# Patient Record
Sex: Female | Born: 1960 | Race: White | Hispanic: No | Marital: Married | Smoking: Never smoker
Health system: Southern US, Community
[De-identification: ages and names within clinical notes are randomized; demographics above are authoritative.]

## PROBLEM LIST (undated history)

## (undated) DIAGNOSIS — K219 Gastro-esophageal reflux disease without esophagitis: Secondary | ICD-10-CM

## (undated) DIAGNOSIS — I10 Essential (primary) hypertension: Secondary | ICD-10-CM

## (undated) HISTORY — DX: Gastro-esophageal reflux disease without esophagitis: K21.9

## (undated) HISTORY — PX: OTHER SURGICAL HISTORY: SHX169

---

## 1998-01-09 ENCOUNTER — Inpatient Hospital Stay (HOSPITAL_COMMUNITY): Admission: AD | Admit: 1998-01-09 | Discharge: 1998-01-09 | Payer: Self-pay | Admitting: Obstetrics and Gynecology

## 1998-05-17 ENCOUNTER — Inpatient Hospital Stay (HOSPITAL_COMMUNITY): Admission: AD | Admit: 1998-05-17 | Discharge: 1998-05-17 | Payer: Self-pay | Admitting: *Deleted

## 1998-06-07 ENCOUNTER — Inpatient Hospital Stay (HOSPITAL_COMMUNITY): Admission: AD | Admit: 1998-06-07 | Discharge: 1998-06-07 | Payer: Self-pay | Admitting: Obstetrics and Gynecology

## 1998-06-09 ENCOUNTER — Inpatient Hospital Stay (HOSPITAL_COMMUNITY): Admission: AD | Admit: 1998-06-09 | Discharge: 1998-06-09 | Payer: Self-pay | Admitting: Obstetrics and Gynecology

## 1998-06-17 ENCOUNTER — Inpatient Hospital Stay (HOSPITAL_COMMUNITY): Admission: AD | Admit: 1998-06-17 | Discharge: 1998-06-19 | Payer: Self-pay | Admitting: Obstetrics and Gynecology

## 1998-07-24 ENCOUNTER — Other Ambulatory Visit: Admission: RE | Admit: 1998-07-24 | Discharge: 1998-07-24 | Payer: Self-pay | Admitting: Obstetrics and Gynecology

## 1998-12-28 ENCOUNTER — Ambulatory Visit (HOSPITAL_COMMUNITY): Admission: RE | Admit: 1998-12-28 | Discharge: 1998-12-28 | Payer: Self-pay | Admitting: *Deleted

## 1999-08-22 ENCOUNTER — Other Ambulatory Visit: Admission: RE | Admit: 1999-08-22 | Discharge: 1999-08-22 | Payer: Self-pay | Admitting: Obstetrics and Gynecology

## 1999-10-11 ENCOUNTER — Inpatient Hospital Stay (HOSPITAL_COMMUNITY): Admission: AD | Admit: 1999-10-11 | Discharge: 1999-10-11 | Payer: Self-pay | Admitting: Obstetrics and Gynecology

## 2000-01-10 ENCOUNTER — Inpatient Hospital Stay (HOSPITAL_COMMUNITY): Admission: AD | Admit: 2000-01-10 | Discharge: 2000-01-10 | Payer: Self-pay | Admitting: *Deleted

## 2000-01-14 ENCOUNTER — Inpatient Hospital Stay (HOSPITAL_COMMUNITY): Admission: AD | Admit: 2000-01-14 | Discharge: 2000-01-14 | Payer: Self-pay | Admitting: Obstetrics & Gynecology

## 2000-01-30 ENCOUNTER — Ambulatory Visit (HOSPITAL_COMMUNITY): Admission: RE | Admit: 2000-01-30 | Discharge: 2000-01-30 | Payer: Self-pay | Admitting: Obstetrics and Gynecology

## 2000-02-14 ENCOUNTER — Inpatient Hospital Stay (HOSPITAL_COMMUNITY): Admission: AD | Admit: 2000-02-14 | Discharge: 2000-02-14 | Payer: Self-pay | Admitting: Obstetrics and Gynecology

## 2000-02-27 ENCOUNTER — Inpatient Hospital Stay (HOSPITAL_COMMUNITY): Admission: AD | Admit: 2000-02-27 | Discharge: 2000-02-29 | Payer: Self-pay | Admitting: Obstetrics and Gynecology

## 2000-04-08 ENCOUNTER — Other Ambulatory Visit: Admission: RE | Admit: 2000-04-08 | Discharge: 2000-04-08 | Payer: Self-pay | Admitting: Obstetrics and Gynecology

## 2001-09-17 ENCOUNTER — Ambulatory Visit (HOSPITAL_COMMUNITY): Admission: RE | Admit: 2001-09-17 | Discharge: 2001-09-17 | Payer: Self-pay | Admitting: Family Medicine

## 2001-09-17 ENCOUNTER — Encounter: Payer: Self-pay | Admitting: Family Medicine

## 2002-04-28 ENCOUNTER — Other Ambulatory Visit: Admission: RE | Admit: 2002-04-28 | Discharge: 2002-04-28 | Payer: Self-pay | Admitting: Obstetrics and Gynecology

## 2003-06-27 ENCOUNTER — Other Ambulatory Visit: Admission: RE | Admit: 2003-06-27 | Discharge: 2003-06-27 | Payer: Self-pay | Admitting: Obstetrics and Gynecology

## 2003-06-27 ENCOUNTER — Other Ambulatory Visit: Admission: RE | Admit: 2003-06-27 | Discharge: 2003-06-27 | Payer: Self-pay | Admitting: Physical Therapy

## 2004-10-13 HISTORY — PX: OTHER SURGICAL HISTORY: SHX169

## 2004-10-13 HISTORY — PX: CHOLECYSTECTOMY: SHX55

## 2004-10-23 ENCOUNTER — Other Ambulatory Visit: Admission: RE | Admit: 2004-10-23 | Discharge: 2004-10-23 | Payer: Self-pay | Admitting: Obstetrics and Gynecology

## 2005-10-13 HISTORY — PX: OTHER SURGICAL HISTORY: SHX169

## 2005-10-27 ENCOUNTER — Encounter: Admission: RE | Admit: 2005-10-27 | Discharge: 2005-10-27 | Payer: Self-pay | Admitting: Gastroenterology

## 2005-11-17 ENCOUNTER — Encounter (INDEPENDENT_AMBULATORY_CARE_PROVIDER_SITE_OTHER): Payer: Self-pay | Admitting: Specialist

## 2005-11-17 ENCOUNTER — Ambulatory Visit (HOSPITAL_COMMUNITY): Admission: RE | Admit: 2005-11-17 | Discharge: 2005-11-18 | Payer: Self-pay | Admitting: General Surgery

## 2005-12-24 ENCOUNTER — Encounter: Admission: RE | Admit: 2005-12-24 | Discharge: 2005-12-24 | Payer: Self-pay | Admitting: General Surgery

## 2006-06-24 ENCOUNTER — Ambulatory Visit (HOSPITAL_COMMUNITY): Admission: RE | Admit: 2006-06-24 | Discharge: 2006-06-24 | Payer: Self-pay | Admitting: Orthopedic Surgery

## 2008-02-22 ENCOUNTER — Encounter: Admission: RE | Admit: 2008-02-22 | Discharge: 2008-02-22 | Payer: Self-pay | Admitting: Family Medicine

## 2011-02-28 NOTE — Op Note (Signed)
Carol Lawson, Carol Lawson                 ACCOUNT NO.:  [id number]   MEDICAL RECORD NO.:  [id number]          PATIENT TYPE:  OIB   LOCATION:  1318                         FACILITY:  [facility]   PHYSICIAN:  Ollen Gross. Vernell Morgans, M.D. DATE OF BIRTH:  1960/12/19   DATE OF PROCEDURE:  11/17/2005  DATE OF DISCHARGE:  11/18/2005                                 OPERATIVE REPORT   PREOPERATIVE DIAGNOSIS:  Gallstones.   POSTOPERATIVE DIAGNOSIS:  Gallstones.   PROCEDURES:  Laparoscopic cholecystectomy with intraoperative cholangiogram.   SURGEON:  Ollen Gross. Carolynne Edouard, M.D.   ASSISTANT:  Leonie Man, M.D.   ANESTHESIA:  General endotracheal.   DESCRIPTION OF PROCEDURE:  After informed consent was obtained, the patient  was brought to the operating room and placed in a supine position on the  operating room table. After adequate induction of general anesthesia, the  patient's abdomen was prepped with Betadine and draped in the usual sterile  manner. The area below the umbilicus was infiltrated 0.25% Marcaine. A mall  incision was made with a 15 blade knife. This incision was carried down  through the subcutaneous tissue bluntly  with a hemostat and [employer]  retractors until the linea alba was identified. The linea alba was incised  with a 15 blade knife and each side was grasped with Kocher clamps and  elevated anteriorly. The preperitoneal space was then probed bluntly with a  hemostat until the peritoneum was opened access was gained to the abdominal  cavity. A #0 Vicryl pursestring stitch was placed in the fascia surrounding  the opening, Hasson cannula was placed through the opening and anchored in  place with the previously placed Vicryl pursestring stitch. The abdomen was  then insufflated with carbon dioxide without difficulty. A laparoscope was  placed through the Hasson cannula and the upper abdomen was inspected. There  were no real adhesions in the upper abdomen from her previous surgery. The  patient was then placed in head-up position and rotated slightly with the  right side up. Next the epigastric region was infiltrated with 0.25%  Marcaine. A small incision was made with a 15 blade knife and a 10 mm port  was placed bluntly through this incision into the abdominal cavity under  direct vision. Sites were then chosen laterally on the right side of the  abdomen for placement of 5 mm ports. Each of these areas was infiltrated  with 0.25% Marcaine. A small stab incision was made with a 15 blade knife  and 5 mm ports were placed bluntly through these incisions into the  abdominal cavity under direct vision. A blunt grasper was placed through the  lateral most 5 mm port and used to grasp the dome of gallbladder and  elevated anteriorly and superiorly. Another blunt grasper was placed through  the other 5 mm port and used to retract on the body and neck of the  gallbladder. A dissector was placed through the epigastric port and using  the electrocautery the peritoneal reflection at the gallbladder neck was  opened, blunt dissection was then carried out in this area until  the  gallbladder neck cystic duct junction was readily identified and a good  window was created. A single clip was placed on the gallbladder neck, a  small ductotomy was made just below the clip with the laparoscopic scissors.  A 14 gauge Angiocath was then placed percutaneously through the anterior  abdominal wall under direct vision. A Reddick cholangiogram catheter was  placed through the Angiocath and flushed. The Reddick catheter was then  placed within the cystic duct and anchored in place with the clip. A  cholangiogram was obtained that showed no filling defects, good emptying in  the duodenum and adequate length on the cystic duct. The anchoring clip and  catheters were then removed from the patient. Three clips were placed  proximally on the cystic duct and the duct was divided between the two sets  of  clips. Posterior to this, the cystic artery was identified and again  dissected bluntly in a circumferential manner until a good window was  created. Two clips were placed proximally and one distally on the artery and  the artery was divided between the two. Next a laparoscopic hook cautery  device was used to separate the gallbladder from the liver bed. Prior to  completely detaching the gallbladder from liver bed, the liver bed was  inspected and several small bleeding points were coagulated with the  electrocautery until the area was completely hemostatic. The gallbladder was  then detached the rest of the way from the liver bed without difficulty. A  laparoscopic bag was inserted through the epigastric port and gallbladder  was placed within the bag and the bag was sealed. Next, the abdomen was  irrigated with copious amounts of saline until the effluent was clear. The  laparoscope was then moved to the epigastric port and a gallbladder grasper  was placed through the Hasson cannula and used to grasp the opening of the  bag. The bag with the gallbladder was then removed through the  infraumbilical port without difficulty. The fascial defect was closed with  the previously placed Vicryl pursestring stitch as well was as with another  figure-of-eight #0 Vicryl stitch. The rest of the ports were removed under  direct vision and were found to be hemostatic. The gas was allowed to  escape. Skin incisions were all closed with interrupted 4-0 Monocryl  subcuticular stitches. Benzoin, Steri-Strips and sterile dressings were  applied. The patient tolerated the procedure well. At the end of the case,  all needle, sponge and instrument counts were correct. The patient was then  awakened and taken to the recovery room in stable condition.      Ollen Gross. Vernell Morgans, M.D.  Electronically Signed     PST/MEDQ  D:  11/20/2005  T:  11/20/2005  Job:  [id number]

## 2011-12-02 ENCOUNTER — Other Ambulatory Visit: Payer: Self-pay | Admitting: Gastroenterology

## 2013-10-03 ENCOUNTER — Encounter: Payer: Self-pay | Admitting: General Surgery

## 2013-10-04 ENCOUNTER — Encounter: Payer: Self-pay | Admitting: General Surgery

## 2013-10-04 ENCOUNTER — Other Ambulatory Visit: Payer: Self-pay | Admitting: *Deleted

## 2013-10-04 ENCOUNTER — Encounter: Payer: Self-pay | Admitting: Cardiology

## 2013-10-04 ENCOUNTER — Ambulatory Visit (INDEPENDENT_AMBULATORY_CARE_PROVIDER_SITE_OTHER): Payer: No Typology Code available for payment source | Admitting: Cardiology

## 2013-10-04 VITALS — BP 148/96 | HR 88 | Ht 63.0 in | Wt 164.0 lb

## 2013-10-04 DIAGNOSIS — I10 Essential (primary) hypertension: Secondary | ICD-10-CM

## 2013-10-04 DIAGNOSIS — R079 Chest pain, unspecified: Secondary | ICD-10-CM

## 2013-10-04 MED ORDER — HYDROCHLOROTHIAZIDE 25 MG PO TABS: 25.0000 mg | ORAL_TABLET | Freq: Every day | ORAL | Status: DC

## 2013-10-04 NOTE — Progress Notes (Signed)
  [address] [place], [place]  [address] Phone: [phone] Fax:  [phone]  Date:  10/04/2013   ID:  Carol Lawson, DOB 1961/02/07, MRN [id number]  PCP:  Sissy Hoff, MD  Cardiologist:  Armanda Magic, MD     History of Present Illness: Carol Lawson is a 52 y.o. female with a history of atypical CP with normal ETT in 2012 who presents today for evaluation of HTN.  She recently saw Dr. Rana Snare for perimenopausal symptoms and was placed on HRT and shortly after starting it she felt terrible.  She has been following her BP and it was elevated and she took the HRT patch off. She says since taking the patch off her BP has been running 138-160's/90's range off of the HRT.  She denies any SOB but has had some chest pain.  The CP occurs randomly throughout the day and usually lasts no more than 1 minutes.  She denies any LE edema, dizziness or palpitations   Wt Readings from Last 3 Encounters:  10/04/13 164 lb (74.39 kg)  10/03/13 167 lb 12.8 oz (76.114 kg)     Past Medical History  Diagnosis Date  . GERD (gastroesophageal reflux disease)     Current Outpatient Prescriptions  Medication Sig Dispense Refill  . zolpidem (AMBIEN) 10 MG tablet Take 10 mg by mouth at bedtime as needed.        No current facility-administered medications for this visit.    Allergies:   No Known Allergies  Social History:  The patient  reports that she has never smoked. She does not have any smokeless tobacco history on file. She reports that she drinks alcohol. She reports that she does not use illicit drugs.   Family History:  The patient's family history includes Breast cancer in her mother; Heart attack in her father; Heart disease in her father.   ROS:  Please see the history of present illness.      All other systems reviewed and negative.   PHYSICAL EXAM: VS:  BP 148/96  Pulse 88  Ht 5\' 3"  (1.6 m)  Wt 164 lb (74.39 kg)  BMI 29.06 kg/m2 Well nourished, well developed, in no  acute distress HEENT: normal Neck: no JVD Cardiac:  normal S1, S2; RRR; no murmur Lungs:  clear to auscultation bilaterally, no wheezing, rhonchi or rales Abd: soft, nontender, no hepatomegaly Ext: no edema Skin: warm and dry Neuro:  CNs 2-12 intact, no focal abnormalities noted  EKG:     NSR with no ST changes  ASSESSMENT AND PLAN:  1. HTN - she does not want to start an antihypertensive med but I explained to her that losing weight will take a while and for the interim she needs her BP better controlled.  I have stressed the importance of a low sodium diet and daily exercise  - start HCTZ 25mg  daily  - check BMET in 2 weeks 2. Atypical CP with normal EKG but family history of CAD at early age  - ETT to rule out ischemia  - 2D echo to assess for LVH and LVF  Followup with me in 2 weeks  Signed, Armanda Magic, MD 10/04/2013 2:32 PM

## 2013-10-04 NOTE — Patient Instructions (Addendum)
Your physician has recommended you make the following change in your medication: Start HCTZ 25 MG 1 tablet Daily  Your physician has requested that you have an exercise tolerance test. For further information please visit [email or web address] Please also follow instruction sheet, as given.  Your physician has requested that you have an echocardiogram. Echocardiography is a painless test that uses sound waves to create images of your heart. It provides your doctor with information about the size and shape of your heart and how well your heart's chambers and valves are working. This procedure takes approximately one hour. There are no restrictions for this procedure.  Your physician recommends that you return for lab work in: 2 weeks for a BMET Panel  Your physician recommends that you schedule a follow-up appointment in: 2 Weeks with Dr Mayford Knife

## 2013-10-07 ENCOUNTER — Ambulatory Visit (HOSPITAL_COMMUNITY): Payer: No Typology Code available for payment source | Attending: Cardiology | Admitting: Radiology

## 2013-10-07 ENCOUNTER — Encounter: Payer: Self-pay | Admitting: Cardiology

## 2013-10-07 DIAGNOSIS — I10 Essential (primary) hypertension: Secondary | ICD-10-CM | POA: Insufficient documentation

## 2013-10-07 DIAGNOSIS — I059 Rheumatic mitral valve disease, unspecified: Secondary | ICD-10-CM | POA: Insufficient documentation

## 2013-10-07 DIAGNOSIS — R079 Chest pain, unspecified: Secondary | ICD-10-CM

## 2013-10-07 DIAGNOSIS — R072 Precordial pain: Secondary | ICD-10-CM

## 2013-10-07 NOTE — Progress Notes (Signed)
Echocardiogram performed.  

## 2013-10-18 ENCOUNTER — Ambulatory Visit (HOSPITAL_COMMUNITY)
Admission: RE | Admit: 2013-10-18 | Discharge: 2013-10-18 | Disposition: A | Discharge status: Home / Self Care | Payer: No Typology Code available for payment source | Source: Ambulatory Visit | Attending: Cardiovascular Disease | Admitting: Cardiovascular Disease

## 2013-10-18 ENCOUNTER — Other Ambulatory Visit: Payer: No Typology Code available for payment source

## 2013-10-18 DIAGNOSIS — R079 Chest pain, unspecified: Secondary | ICD-10-CM

## 2013-10-20 ENCOUNTER — Encounter: Payer: Self-pay | Admitting: Cardiology

## 2013-10-20 ENCOUNTER — Ambulatory Visit (INDEPENDENT_AMBULATORY_CARE_PROVIDER_SITE_OTHER): Payer: No Typology Code available for payment source | Admitting: Cardiology

## 2013-10-20 ENCOUNTER — Other Ambulatory Visit: Payer: No Typology Code available for payment source

## 2013-10-20 ENCOUNTER — Encounter: Payer: Self-pay | Admitting: General Surgery

## 2013-10-20 VITALS — BP 115/78 | HR 92 | Ht 63.0 in | Wt 162.1 lb

## 2013-10-20 DIAGNOSIS — R51 Headache: Secondary | ICD-10-CM

## 2013-10-20 DIAGNOSIS — I10 Essential (primary) hypertension: Secondary | ICD-10-CM

## 2013-10-20 DIAGNOSIS — R519 Headache, unspecified: Secondary | ICD-10-CM | POA: Insufficient documentation

## 2013-10-20 DIAGNOSIS — G4710 Hypersomnia, unspecified: Secondary | ICD-10-CM

## 2013-10-20 LAB — BASIC METABOLIC PANEL
BUN: 12 mg/dL (ref 6–23)
CHLORIDE: 103 meq/L (ref 96–112)
CO2: 30 mEq/L (ref 19–32)
Calcium: 9.1 mg/dL (ref 8.4–10.5)
Creatinine, Ser: 0.9 mg/dL (ref 0.4–1.2)
GFR: 70.44 mL/min (ref >60.00)
Glucose, Bld: 111 mg/dL — ABNORMAL HIGH (ref 70–99)
POTASSIUM: 4.1 meq/L (ref 3.5–5.1)
SODIUM: 140 meq/L (ref 135–145)

## 2013-10-20 NOTE — Addendum Note (Signed)
Addended by: Eulis Foster on: 10/20/2013 08:45 AM   Modules accepted: Orders

## 2013-10-20 NOTE — Patient Instructions (Addendum)
Your physician recommends that you continue on your current medications as directed. Please refer to the Current Medication list given to you today.  Your physician has requested that you regularly monitor and record your blood pressure readings at home. Please use the same machine at the same time of day to check your readings. Please record them for one week and call us with the results at the end of the week. [phone]  Your physician has recommended that you have a sleep study. This test records several body functions during sleep, including: brain activity, eye movement, oxygen and carbon dioxide blood levels, heart rate and rhythm, breathing rate and rhythm, the flow of air through your mouth and nose, snoring, body muscle movements, and chest and belly movement.([facility] schedule [facility])  Your physician recommends that you schedule a follow-up appointment As Needed

## 2013-10-20 NOTE — Progress Notes (Signed)
  [place] [employer] [place], [place]  [address] Phone: [phone] Fax:  [phone]  Date:  10/20/2013   ID:  Carol Lawson, DOB Mar 24, 1961, MRN [id number]  PCP:  Gara Kroner, MD  Cardiologist:  Fransico Him, mD     History of Present Illness: Carol Lawson is a 53 y.o. female with a history of atypical CP with normal ETT in 2012 who presents recently for evaluation of HTN. She recently saw Dr. Corinna Capra for perimenopausal symptoms and was placed on HRT and shortly after starting it she felt terrible. She had been following her BP and it was elevated and she took the HRT patch off. Since taking the patch off her BP had been running 138-160's/90's range off of the HRT. She deniesd any SOB but has some chest pain. The CP occured randomly throughout the day and usually lasted no more than 1 minute. She denied any LE edema, dizziness or palpitations.  She underwent ETT which showed no ischemia but her baseline BP was 140/147mmHg.  At home the BP has been running 140/90-81mmHg at its highest and other times it is in the normal range.  She complains of intermittent headaches.  She also says that she feels exhausted during the day and is very tired during the day.  She does snore at night.   Wt Readings from Last 3 Encounters:  10/04/13 164 lb (74.39 kg)  10/03/13 167 lb 12.8 oz (76.114 kg)     Past Medical History  Diagnosis Date  . GERD (gastroesophageal reflux disease)     Current Outpatient Prescriptions  Medication Sig Dispense Refill  . hydrochlorothiazide (HYDRODIURIL) 25 MG tablet Take 1 tablet (25 mg total) by mouth daily.  30 tablet  11  . zolpidem (AMBIEN) 10 MG tablet Take 10 mg by mouth at bedtime as needed.        No current facility-administered medications for this visit.    Allergies:   No Known Allergies  Social History:  The patient  reports that she has never smoked. She does not have any smokeless tobacco history on file. She reports that she drinks alcohol. She  reports that she does not use illicit drugs.   Family History:  The patient's family history includes Breast cancer in her mother; Heart attack in her father; Heart disease in her father.   ROS:  Please see the history of present illness.      All other systems reviewed and negative.   PHYSICAL EXAM: VS:  There were no vitals taken for this visit. Well nourished, well developed, in no acute distress HEENT: normal Neck: no JVD Cardiac:  normal S1, S2; RRR; no murmur Lungs:  clear to auscultation bilaterally, no wheezing, rhonchi or rales Abd: soft, nontender, no hepatomegaly Ext: no edema Skin: warm and dry Neuro:  CNs 2-12 intact, no focal abnormalities noted      ASSESSMENT AND PLAN:  1. HTN - BP good today but her BP at the time of the ETT was 141/175mmHg.    - I have asked her to check her BP daily for a week and call with results 2. Chest pain with no ischemia on ETT. 3. Daytime fatigue and sleepiness with snoring and daytime headaches  - get a PSG to rule out sleep apnea  Followup PRN  Signed, Fransico Him, MD 10/20/2013 8:17 AM

## 2015-03-08 ENCOUNTER — Other Ambulatory Visit: Payer: Self-pay | Admitting: Obstetrics and Gynecology

## 2015-03-13 LAB — CYTOLOGY - PAP

## 2017-10-07 ENCOUNTER — Encounter (HOSPITAL_BASED_OUTPATIENT_CLINIC_OR_DEPARTMENT_OTHER): Payer: Self-pay

## 2017-10-07 ENCOUNTER — Other Ambulatory Visit: Payer: Self-pay

## 2017-10-07 ENCOUNTER — Emergency Department (HOSPITAL_BASED_OUTPATIENT_CLINIC_OR_DEPARTMENT_OTHER)
Admission: EM | Admit: 2017-10-07 | Discharge: 2017-10-08 | Disposition: A | Discharge status: Home / Self Care | Payer: No Typology Code available for payment source | Attending: Emergency Medicine | Admitting: Emergency Medicine

## 2017-10-07 DIAGNOSIS — R1010 Upper abdominal pain, unspecified: Secondary | ICD-10-CM | POA: Diagnosis present

## 2017-10-07 DIAGNOSIS — K5289 Other specified noninfective gastroenteritis and colitis: Secondary | ICD-10-CM | POA: Diagnosis not present

## 2017-10-07 DIAGNOSIS — I10 Essential (primary) hypertension: Secondary | ICD-10-CM | POA: Insufficient documentation

## 2017-10-07 DIAGNOSIS — K529 Noninfective gastroenteritis and colitis, unspecified: Secondary | ICD-10-CM

## 2017-10-07 HISTORY — DX: Essential (primary) hypertension: I10

## 2017-10-07 LAB — COMPREHENSIVE METABOLIC PANEL
ALT: 48 U/L (ref 14–54)
ANION GAP: 9 (ref 5–15)
AST: 32 U/L (ref 15–41)
Albumin: 4.6 g/dL (ref 3.5–5.0)
Alkaline Phosphatase: 89 U/L (ref 38–126)
BUN: 13 mg/dL (ref 6–20)
CHLORIDE: 103 mmol/L (ref 101–111)
CO2: 24 mmol/L (ref 22–32)
Calcium: 9.5 mg/dL (ref 8.9–10.3)
Creatinine, Ser: 0.68 mg/dL (ref 0.44–1.00)
GFR calc non Af Amer: >60 mL/min (ref >60)
Glucose, Bld: 113 mg/dL — ABNORMAL HIGH (ref 65–99)
Potassium: 3.7 mmol/L (ref 3.5–5.1)
SODIUM: 136 mmol/L (ref 135–145)
Total Bilirubin: 0.6 mg/dL (ref 0.3–1.2)
Total Protein: 8.1 g/dL (ref 6.5–8.1)

## 2017-10-07 LAB — CBC WITH DIFFERENTIAL/PLATELET
BASOS PCT: 0 %
Basophils Absolute: 0 10*3/uL (ref 0.0–0.1)
Eosinophils Absolute: 0.1 10*3/uL (ref 0.0–0.7)
Eosinophils Relative: 1 %
HEMATOCRIT: 45.8 % (ref 36.0–46.0)
HEMOGLOBIN: 15.7 g/dL — AB (ref 12.0–15.0)
LYMPHS ABS: 3.9 10*3/uL (ref 0.7–4.0)
Lymphocytes Relative: 26 %
MCH: 30.4 pg (ref 26.0–34.0)
MCHC: 34.3 g/dL (ref 30.0–36.0)
MCV: 88.6 fL (ref 78.0–100.0)
MONOS PCT: 8 %
Monocytes Absolute: 1.2 10*3/uL — ABNORMAL HIGH (ref 0.1–1.0)
NEUTROS ABS: 9.7 10*3/uL — AB (ref 1.7–7.7)
NEUTROS PCT: 65 %
Platelets: 213 10*3/uL (ref 150–400)
RBC: 5.17 MIL/uL — ABNORMAL HIGH (ref 3.87–5.11)
RDW: 12.6 % (ref 11.5–15.5)
WBC: 14.9 10*3/uL — ABNORMAL HIGH (ref 4.0–10.5)

## 2017-10-07 LAB — URINALYSIS, ROUTINE W REFLEX MICROSCOPIC
Bilirubin Urine: NEGATIVE
Glucose, UA: NEGATIVE mg/dL
Ketones, ur: NEGATIVE mg/dL
LEUKOCYTES UA: NEGATIVE
NITRITE: NEGATIVE
PH: 7 (ref 5.0–8.0)
Protein, ur: NEGATIVE mg/dL
SPECIFIC GRAVITY, URINE: 1.02 (ref 1.005–1.030)

## 2017-10-07 LAB — URINALYSIS, MICROSCOPIC (REFLEX): WBC UA: NONE SEEN WBC/hpf (ref 0–5)

## 2017-10-07 LAB — LIPASE, BLOOD: LIPASE: 35 U/L (ref 11–51)

## 2017-10-07 MED ORDER — ONDANSETRON HCL 4 MG/2ML IJ SOLN
4.0000 mg | Freq: Once | INTRAMUSCULAR | Status: AC | PRN
  Administered 2017-10-07: 4 mg via INTRAVENOUS
  Filled 2017-10-07: qty 2

## 2017-10-07 MED ORDER — FENTANYL CITRATE (PF) 100 MCG/2ML IJ SOLN
100.0000 ug | Freq: Once | INTRAMUSCULAR | Status: AC
  Administered 2017-10-07: 100 ug via INTRAVENOUS
  Filled 2017-10-07: qty 2

## 2017-10-07 MED ORDER — SODIUM CHLORIDE 0.9 % IV BOLUS (SEPSIS)
1000.0000 mL | Freq: Once | INTRAVENOUS | Status: AC
  Administered 2017-10-08: 1000 mL via INTRAVENOUS

## 2017-10-07 MED ORDER — ONDANSETRON HCL 4 MG/2ML IJ SOLN: 4.0000 mg | Freq: Once | INTRAMUSCULAR | Status: DC

## 2017-10-07 NOTE — ED Triage Notes (Addendum)
Upper abdominal pain that started around 2000 that seems to come in waves, with nausea, pt tried antacids without relief, pt retching in triage

## 2017-10-07 NOTE — ED Provider Notes (Signed)
[facility] Provider Note: Georgena Spurling, MD, FACEP  CSN: [id number] MRN: [id number] ARRIVAL: 10/07/17 at 2317 ROOM: [place]  Abdominal Pain   HISTORY OF PRESENT ILLNESS  10/07/17 11:49 PM Carol Lawson is a 56 y.o. female who developed upper abdominal pain about 8:30 PM.  The onset was fairly sudden.  She describes the pain is sharp.  It is located across her upper abdomen, most prominent in the right upper quadrant.  She is status post cholecystectomy.  She rates her pain as a 9 out of 10 presently although it comes in waves.  It is worse with movement or palpation.  She has had associated nausea and vomiting but no diarrhea.  She denies fever or chills.  She took Tums without relief.   Past Medical History:  Diagnosis Date  . GERD (gastroesophageal reflux disease)   . Hypertension    not on medication    Past Surgical History:  Procedure Laterality Date  . ACL repair of knee  2006  . CHOLECYSTECTOMY  2006  . intestinal sx     age 60  . meniscus repair of knee  2007    Family History  Problem Relation Age of Onset  . Breast cancer Mother   . Heart attack Father   . Heart disease Father     Social History   Tobacco Use  . Smoking status: Never Smoker  Substance Use Topics  . Alcohol use: Yes    Comment: occasionally wine  . Drug use: No    Prior to Admission medications   Medication Sig Start Date End Date Taking? Authorizing Provider  hydrochlorothiazide (HYDRODIURIL) 25 MG tablet Take 1 tablet (25 mg total) by mouth daily. Patient not taking: Reported on 10/07/2017 10/04/13   Sueanne Margarita, MD  zolpidem (AMBIEN) 10 MG tablet Take 10 mg by mouth at bedtime as needed.  10/02/13   [provider]    Allergies Patient has no known allergies.   REVIEW OF SYSTEMS  Negative except as noted here or in the History of Present Illness.   PHYSICAL EXAMINATION  Initial Vital Signs Blood pressure (!) 179/99, pulse  (!) 104, temperature 98.1 F (36.7 C), temperature source Oral, resp. rate 20, height 5\' 4"  (1.626 m), weight 73.9 kg (163 lb), SpO2 100 %.  Examination General: Well-developed, well-nourished female in no acute distress; appearance consistent with age of record HENT: normocephalic; atraumatic Eyes: pupils equal, round and reactive to light; extraocular muscles intact Neck: supple Heart: regular rate and rhythm Lungs: clear to auscultation bilaterally Abdomen: soft; nondistended; upper abdominal tenderness most prominent in the right upper quadrant; no masses or hepatosplenomegaly; bowel sounds present Extremities: No deformity; full range of motion; pulses normal Neurologic: Awake, alert and oriented; motor function intact in all extremities and symmetric; no facial droop Skin: Warm and dry Psychiatric: Grimacing   RESULTS  Summary of this visit's results, reviewed by myself:   EKG Interpretation  Date/Time:    Ventricular Rate:    PR Interval:    QRS Duration:   QT Interval:    QTC Calculation:   R Axis:     Text Interpretation:        Laboratory Studies: Results for orders placed or performed during the hospital encounter of 10/07/17 (from the past 24 hour(s))  Urinalysis, Routine w reflex microscopic     Status: Abnormal   Collection Time: 10/07/17 11:24 PM  Result Value Ref Range   Color, Urine  YELLOW YELLOW   APPearance CLEAR CLEAR   Specific Gravity, Urine 1.020 1.005 - 1.030   pH 7.0 5.0 - 8.0   Glucose, UA NEGATIVE NEGATIVE mg/dL   Hgb urine dipstick SMALL (A) NEGATIVE   Bilirubin Urine NEGATIVE NEGATIVE   Ketones, ur NEGATIVE NEGATIVE mg/dL   Protein, ur NEGATIVE NEGATIVE mg/dL   Nitrite NEGATIVE NEGATIVE   Leukocytes, UA NEGATIVE NEGATIVE  Urinalysis, Microscopic (reflex)     Status: Abnormal   Collection Time: 10/07/17 11:24 PM  Result Value Ref Range   RBC / HPF 0-5 0 - 5 RBC/hpf   WBC, UA NONE SEEN 0 - 5 WBC/hpf   Bacteria, UA RARE (A) NONE SEEN    Squamous Epithelial / LPF 0-5 (A) NONE SEEN  Lipase, blood     Status: None   Collection Time: 10/07/17 11:27 PM  Result Value Ref Range   Lipase 35 11 - 51 U/L  Comprehensive metabolic panel     Status: Abnormal   Collection Time: 10/07/17 11:27 PM  Result Value Ref Range   Sodium 136 135 - 145 mmol/L   Potassium 3.7 3.5 - 5.1 mmol/L   Chloride 103 101 - 111 mmol/L   CO2 24 22 - 32 mmol/L   Glucose, Bld 113 (H) 65 - 99 mg/dL   BUN 13 6 - 20 mg/dL   Creatinine, Ser 0.68 0.44 - 1.00 mg/dL   Calcium 9.5 8.9 - 10.3 mg/dL   Total Protein 8.1 6.5 - 8.1 g/dL   Albumin 4.6 3.5 - 5.0 g/dL   AST 32 15 - 41 U/L   ALT 48 14 - 54 U/L   Alkaline Phosphatase 89 38 - 126 U/L   Total Bilirubin 0.6 0.3 - 1.2 mg/dL   GFR calc non Af Amer >60 >60 mL/min   GFR calc Af Amer >60 >60 mL/min   Anion gap 9 5 - 15  CBC with Differential     Status: Abnormal   Collection Time: 10/07/17 11:27 PM  Result Value Ref Range   WBC 14.9 (H) 4.0 - 10.5 K/uL   RBC 5.17 (H) 3.87 - 5.11 MIL/uL   Hemoglobin 15.7 (H) 12.0 - 15.0 g/dL   HCT 45.8 36.0 - 46.0 %   MCV 88.6 78.0 - 100.0 fL   MCH 30.4 26.0 - 34.0 pg   MCHC 34.3 30.0 - 36.0 g/dL   RDW 12.6 11.5 - 15.5 %   Platelets 213 150 - 400 K/uL   Neutrophils Relative % 65 %   Neutro Abs 9.7 (H) 1.7 - 7.7 K/uL   Lymphocytes Relative 26 %   Lymphs Abs 3.9 0.7 - 4.0 K/uL   Monocytes Relative 8 %   Monocytes Absolute 1.2 (H) 0.1 - 1.0 K/uL   Eosinophils Relative 1 %   Eosinophils Absolute 0.1 0.0 - 0.7 K/uL   Basophils Relative 0 %   Basophils Absolute 0.0 0.0 - 0.1 K/uL   Imaging Studies: Ct Abdomen Pelvis W Contrast  Result Date: 10/08/2017 CLINICAL DATA:  Acute onset of mid abdominal pain and nausea. Hematuria. Leukocytosis. EXAM: CT ABDOMEN AND PELVIS WITH CONTRAST TECHNIQUE: Multidetector CT imaging of the abdomen and pelvis was performed using the standard protocol following bolus administration of intravenous contrast. CONTRAST:  179mL ISOVUE-300  IOPAMIDOL (ISOVUE-300) INJECTION 61% COMPARISON:  Right upper quadrant ultrasound performed 12/24/2005 FINDINGS: Lower chest: The visualized lung bases are grossly clear. The visualized portions of the mediastinum are unremarkable. Hepatobiliary: The liver is unremarkable in appearance. The patient is  status post cholecystectomy, with clips noted at the gallbladder fossa. The common bile duct remains normal in caliber. Pancreas: The pancreas is within normal limits. Spleen: The spleen is unremarkable in appearance. Adrenals/Urinary Tract: The adrenal glands are unremarkable in appearance. The kidneys are within normal limits. There is no evidence of hydronephrosis. No renal or ureteral stones are identified. No perinephric stranding is seen. Stomach/Bowel: The stomach is unremarkable in appearance. The appendix is normal in caliber, without evidence of appendicitis. The colon is unremarkable in appearance. Mild wall thickening is noted along the distal ileum, with associated mesenteric edema, concerning for infectious or inflammatory ileitis. Additional vague inflammation is seen tracking proximally along the mesentery. Vascular/Lymphatic: The abdominal aorta is unremarkable in appearance. The inferior vena cava is grossly unremarkable. No retroperitoneal lymphadenopathy is seen. No pelvic sidewall lymphadenopathy is identified. Reproductive: The bladder is mildly distended and grossly unremarkable. An intrauterine device is noted in expected position at the fundus of the uterus. A small amount of free fluid is seen within the pelvis. The ovaries are relatively symmetric. No suspicious adnexal masses are seen. Other: No additional soft tissue abnormalities are seen. Musculoskeletal: No acute osseous abnormalities are identified. The visualized musculature is unremarkable in appearance. IMPRESSION: Mild wall thickening along the distal ileum, with associated mesenteric edema, concerning for infectious or inflammatory  ileitis. Additional vague inflammation noted tracking proximally along the mesentery. Electronically Signed   By: Garald Balding M.D.   On: 10/08/2017 01:44    ED COURSE  Nursing notes and initial vitals signs, including pulse oximetry, reviewed.  Vitals:   10/07/17 2322 10/07/17 2323 10/08/17 0232  BP:  (!) 179/99 117/73  Pulse:  (!) 104 91  Resp:  20 16  Temp:  98.1 F (36.7 C)   TempSrc:  Oral   SpO2:  100% 97%  Weight: 73.9 kg (163 lb)    Height: 5\' 4"  (1.626 m)     3:04 AM Patient feeling better.  She was advised of her CT findings.  This could represent a first episode of Crohn's disease but she has no history of inflammatory bowel disease.  Its rapid onset suggests an acute enteritis.  She states she had seafood for dinner yesterday evening although she has not had a reaction to seafood in the past.  She does not wish to be admitted to the hospital.  We will treat her symptomatically and have her return if symptoms worsen or persist.  She was advised of the possibility of inflammatory bowel disease.   PROCEDURES    ED DIAGNOSES     ICD-10-CM   1. Enteritis K52.9        Jimel Myler, MD 10/08/17 [phone]

## 2017-10-08 ENCOUNTER — Emergency Department (HOSPITAL_BASED_OUTPATIENT_CLINIC_OR_DEPARTMENT_OTHER): Payer: No Typology Code available for payment source

## 2017-10-08 MED ORDER — DICYCLOMINE HCL 20 MG PO TABS: 20.0000 mg | ORAL_TABLET | Freq: Four times a day (QID) | ORAL | 0 refills | Status: DC | PRN

## 2017-10-08 MED ORDER — PROMETHAZINE HCL 25 MG PO TABS: 25.0000 mg | ORAL_TABLET | Freq: Four times a day (QID) | ORAL | 0 refills | Status: DC | PRN

## 2017-10-08 MED ORDER — IOPAMIDOL (ISOVUE-300) INJECTION 61%
100.0000 mL | Freq: Once | INTRAVENOUS | Status: AC | PRN
  Administered 2017-10-08: 100 mL via INTRAVENOUS

## 2017-10-08 MED ORDER — FENTANYL CITRATE (PF) 100 MCG/2ML IJ SOLN
100.0000 ug | Freq: Once | INTRAMUSCULAR | Status: AC
  Administered 2017-10-08: 100 ug via INTRAVENOUS
  Filled 2017-10-08: qty 2

## 2017-10-08 MED ORDER — HALOPERIDOL LACTATE 5 MG/ML IJ SOLN
2.5000 mg | Freq: Once | INTRAMUSCULAR | Status: AC
  Administered 2017-10-08: 2.5 mg via INTRAVENOUS
  Filled 2017-10-08: qty 1

## 2017-10-08 MED ORDER — HALOPERIDOL LACTATE 5 MG/ML IJ SOLN
2.5000 mg | Freq: Once | INTRAMUSCULAR | Status: AC
  Administered 2017-10-08: 2.5 mg via INTRAVENOUS

## 2017-10-08 NOTE — ED Notes (Signed)
Oral contrast at bedside

## 2017-10-08 NOTE — ED Notes (Signed)
ED Provider at bedside. 

## 2018-06-24 ENCOUNTER — Encounter (HOSPITAL_BASED_OUTPATIENT_CLINIC_OR_DEPARTMENT_OTHER): Payer: Self-pay | Admitting: Emergency Medicine

## 2018-06-24 ENCOUNTER — Other Ambulatory Visit: Payer: Self-pay

## 2018-06-24 ENCOUNTER — Emergency Department (HOSPITAL_BASED_OUTPATIENT_CLINIC_OR_DEPARTMENT_OTHER): Payer: No Typology Code available for payment source

## 2018-06-24 ENCOUNTER — Other Ambulatory Visit (HOSPITAL_COMMUNITY): Payer: Self-pay | Admitting: Family Medicine

## 2018-06-24 ENCOUNTER — Emergency Department (HOSPITAL_BASED_OUTPATIENT_CLINIC_OR_DEPARTMENT_OTHER)
Admission: EM | Admit: 2018-06-24 | Discharge: 2018-06-24 | Disposition: A | Discharge status: Home / Self Care | Payer: No Typology Code available for payment source | Attending: Emergency Medicine | Admitting: Emergency Medicine

## 2018-06-24 DIAGNOSIS — I10 Essential (primary) hypertension: Secondary | ICD-10-CM | POA: Diagnosis not present

## 2018-06-24 DIAGNOSIS — R1010 Upper abdominal pain, unspecified: Secondary | ICD-10-CM

## 2018-06-24 DIAGNOSIS — I880 Nonspecific mesenteric lymphadenitis: Secondary | ICD-10-CM | POA: Diagnosis not present

## 2018-06-24 DIAGNOSIS — Z79899 Other long term (current) drug therapy: Secondary | ICD-10-CM | POA: Diagnosis not present

## 2018-06-24 DIAGNOSIS — K6389 Other specified diseases of intestine: Secondary | ICD-10-CM

## 2018-06-24 LAB — URINALYSIS, MICROSCOPIC (REFLEX)

## 2018-06-24 LAB — COMPREHENSIVE METABOLIC PANEL
ALK PHOS: 69 U/L (ref 38–126)
ALT: 22 U/L (ref 0–44)
ANION GAP: 11 (ref 5–15)
AST: 17 U/L (ref 15–41)
Albumin: 4 g/dL (ref 3.5–5.0)
BILIRUBIN TOTAL: 0.9 mg/dL (ref 0.3–1.2)
BUN: 18 mg/dL (ref 6–20)
CHLORIDE: 102 mmol/L (ref 98–111)
CO2: 24 mmol/L (ref 22–32)
Calcium: 8.5 mg/dL — ABNORMAL LOW (ref 8.9–10.3)
Creatinine, Ser: 0.8 mg/dL (ref 0.44–1.00)
GFR calc Af Amer: >60 mL/min (ref >60)
GFR calc non Af Amer: >60 mL/min (ref >60)
GLUCOSE: 103 mg/dL — AB (ref 70–99)
POTASSIUM: 3.7 mmol/L (ref 3.5–5.1)
SODIUM: 137 mmol/L (ref 135–145)
TOTAL PROTEIN: 7 g/dL (ref 6.5–8.1)

## 2018-06-24 LAB — CBC
HEMATOCRIT: 42.5 % (ref 36.0–46.0)
HEMOGLOBIN: 14.8 g/dL (ref 12.0–15.0)
MCH: 31.1 pg (ref 26.0–34.0)
MCHC: 34.8 g/dL (ref 30.0–36.0)
MCV: 89.3 fL (ref 78.0–100.0)
Platelets: 164 10*3/uL (ref 150–400)
RBC: 4.76 MIL/uL (ref 3.87–5.11)
RDW: 12.6 % (ref 11.5–15.5)
WBC: 8.2 10*3/uL (ref 4.0–10.5)

## 2018-06-24 LAB — URINALYSIS, ROUTINE W REFLEX MICROSCOPIC
Bilirubin Urine: NEGATIVE
Glucose, UA: NEGATIVE mg/dL
Ketones, ur: 40 mg/dL — AB
LEUKOCYTES UA: NEGATIVE
NITRITE: NEGATIVE
Protein, ur: NEGATIVE mg/dL
SPECIFIC GRAVITY, URINE: 1.025 (ref 1.005–1.030)
pH: 6 (ref 5.0–8.0)

## 2018-06-24 LAB — LIPASE, BLOOD: Lipase: 24 U/L (ref 11–51)

## 2018-06-24 MED ORDER — ONDANSETRON HCL 4 MG/2ML IJ SOLN
4.0000 mg | Freq: Once | INTRAMUSCULAR | Status: AC | PRN
  Administered 2018-06-24: 4 mg via INTRAVENOUS
  Filled 2018-06-24: qty 2

## 2018-06-24 MED ORDER — FENTANYL CITRATE (PF) 100 MCG/2ML IJ SOLN
50.0000 ug | INTRAMUSCULAR | Status: DC | PRN
  Administered 2018-06-24: 50 ug via INTRAVENOUS
  Filled 2018-06-24: qty 2

## 2018-06-24 MED ORDER — AMOXICILLIN-POT CLAVULANATE 875-125 MG PO TABS: 1.0000 | ORAL_TABLET | Freq: Two times a day (BID) | ORAL | 0 refills | Status: DC

## 2018-06-24 MED FILL — AMOX-CLAV 875-125 MG TABLET: 875-125 | 7 days supply | Qty: 14 | Fill #0

## 2018-06-24 NOTE — ED Provider Notes (Signed)
[facility] Provider Note   CSN: [id number] Arrival date & time: 06/24/18  0535     History   Chief Complaint Chief Complaint  Patient presents with  . Flank Pain    HPI Carol Lawson is a 57 y.o. female.  HPI  57 year old female presents with back and abdominal pain starting 4 days ago.  The patient states that she took ibuprofen the first day and seemed to get better.  Then she started having vomiting and went to see her PCP a couple days ago.  Based on a urinalysis they diagnosed with kidney stones and started her on antinausea medicine and hydrocodone.  However the pain has been worsening.  Patient denies any specific urinary symptoms.  The pain is bilateral in her back and wraps around both sides to the front.  Pain is severe. Pain occasionally spasms and worsens. Nothing she does such as movement worsens the pain.  Past Medical History:  Diagnosis Date  . GERD (gastroesophageal reflux disease)   . Hypertension    not on medication    Patient Active Problem List   Diagnosis Date Noted  . Hypersomnia 10/20/2013  . Headache 10/20/2013  . HTN (hypertension) 10/04/2013  . Chest pain 10/04/2013    Past Surgical History:  Procedure Laterality Date  . ACL repair of knee  2006  . CHOLECYSTECTOMY  2006  . intestinal sx     age 89  . meniscus repair of knee  2007     OB History   None      Home Medications    Prior to Admission medications   Medication Sig Start Date End Date Taking? Authorizing Provider  HYDROcodone-acetaminophen (NORCO/VICODIN) 5-325 MG tablet Take 1 tablet by mouth every 6 (six) hours as needed for moderate pain.   Yes [provider]  ondansetron (ZOFRAN) 4 MG tablet Take 4 mg by mouth every 8 (eight) hours as needed for nausea or vomiting.   Yes [provider]  amoxicillin-clavulanate (AUGMENTIN) 875-125 MG tablet Take 1 tablet by mouth 2 (two) times daily. 06/24/18   Sherwood Gambler, MD     Family History Family History  Problem Relation Age of Onset  . Breast cancer Mother   . Heart attack Father   . Heart disease Father     Social History Social History   Tobacco Use  . Smoking status: Never Smoker  Substance Use Topics  . Alcohol use: Yes    Comment: occasionally wine  . Drug use: No     Allergies   Patient has no known allergies.   Review of Systems Review of Systems  Gastrointestinal: Positive for abdominal pain, nausea and vomiting.  Genitourinary: Negative for dysuria and hematuria.  Musculoskeletal: Positive for back pain.  All other systems reviewed and are negative.    Physical Exam Updated Vital Signs BP 108/75   Pulse 70   Temp 98 F (36.7 C) (Oral)   Resp 18   SpO2 96%   Physical Exam  Constitutional: She is oriented to person, place, and time. She appears well-developed and well-nourished. No distress.  HENT:  Head: Normocephalic and atraumatic.  Right Ear: External ear normal.  Left Ear: External ear normal.  Nose: Nose normal.  Eyes: Right eye exhibits no discharge. Left eye exhibits no discharge.  Cardiovascular: Normal rate, regular rhythm and normal heart sounds.  Pulmonary/Chest: Effort normal and breath sounds normal.  Abdominal: Soft. There is tenderness in the right upper quadrant. There is  CVA tenderness (bilateral).  Neurological: She is alert and oriented to person, place, and time.  Skin: Skin is warm and dry. She is not diaphoretic.  Nursing note and vitals reviewed.    ED Treatments / Results  Labs (all labs ordered are listed, but only abnormal results are displayed) Labs Reviewed  URINALYSIS, ROUTINE W REFLEX MICROSCOPIC - Abnormal; Notable for the following components:      Result Value   Hgb urine dipstick MODERATE (*)    Ketones, ur 40 (*)    All other components within normal limits  URINALYSIS, MICROSCOPIC (REFLEX) - Abnormal; Notable for the following components:   Bacteria, UA MANY (*)     All other components within normal limits  COMPREHENSIVE METABOLIC PANEL - Abnormal; Notable for the following components:   Glucose, Bld 103 (*)    Calcium 8.5 (*)    All other components within normal limits  URINE CULTURE  LIPASE, BLOOD  CBC    EKG None  Radiology Ct Renal Stone Study  Result Date: 06/24/2018 CLINICAL DATA:  Hematuria EXAM: CT ABDOMEN AND PELVIS WITHOUT CONTRAST TECHNIQUE: Multidetector CT imaging of the abdomen and pelvis was performed following the standard protocol without IV contrast. COMPARISON:  10/08/2017 FINDINGS: Lower chest: Lung bases are clear. No effusions. Heart is normal size. Hepatobiliary: No focal liver abnormality is seen. Status post cholecystectomy. No biliary dilatation. Pancreas: No focal abnormality or ductal dilatation. Spleen: No focal abnormality.  Normal size. Adrenals/Urinary Tract: No adrenal abnormality. No focal renal abnormality. No stones or hydronephrosis. Urinary bladder is unremarkable. Stomach/Bowel: Normal appendix. Stomach, large and small bowel grossly unremarkable. Vascular/Lymphatic: No aneurysm. Ill-defined soft tissue mass in the right mesentery measures up to 6 cm. Reproductive: IUD noted in the uterus. Uterus and adnexa unremarkable. No mass. Other: No free fluid or free air. Musculoskeletal: No acute bony abnormality. IMPRESSION: Masslike haziness/opacity in the right central mesentery, progressed in size and density since prior study. 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 Prior cholecystectomy. Normal appendix. Electronically Signed   By: Rolm Baptise M.D.   On: 06/24/2018 07:14    Procedures Procedures (including critical care time)  Medications Ordered in ED Medications  fentaNYL (SUBLIMAZE) injection 50 mcg (50 mcg Intravenous Given 06/24/18 0648)  ondansetron (ZOFRAN) injection 4 mg (4 mg Intravenous Given 06/24/18  0646)     Initial Impression / Assessment and Plan / ED Course  I have reviewed the triage vital signs and the nursing notes.  Pertinent labs & imaging results that were available during my care of the patient were reviewed by me and considered in my medical decision making (see chart for details).     I discussed the CT scan results with Dr. Benay Spice, oncology.  He recommends the patient could follow-up with oncology but would probably be better served starting with the PCP to help further delineate if there is other lymphadenopathy and whether or not the patient needs a surgeon for biopsy.  Given the acute onset of the symptoms with the vomiting, I will also cover with antibiotics for mesenteric adenitis.  I discussed the results and possible lymphoma with patient and husband.  They understand the importance of following up closely with their PCP.  Strict return precautions.  Final Clinical Impressions(s) / ED Diagnoses   Final diagnoses:  Upper abdominal pain  Mesenteric adenitis    ED Discharge Orders         Ordered  amoxicillin-clavulanate (AUGMENTIN) 875-125 MG tablet  2 times daily     06/24/18 7981           Sherwood Gambler, MD 06/24/18 [phone]

## 2018-06-24 NOTE — ED Notes (Signed)
Patient transported to CT 

## 2018-06-24 NOTE — ED Triage Notes (Signed)
Pt c/o lower back pain radiating round to abd. Pt states she was [facility] and dx with kidney stone due to having blood in her urine. Pt was given zofran and vicodin for pain which are not helping. Pt has not has CT to confirm dx of kidney stones.

## 2018-06-24 NOTE — Discharge Instructions (Signed)
Your CT scan shows inflammation of the mesentery in your right sided abdomen.  This could be from inflammation/infection called mesenteric adenitis.  However the radiologist is also concerned this could represent a mesenteric lymphoma.  You will need follow-up and work-up by your primary care doctor, Dr. Moreen Fowler.  Call him today to help set up this appointment for outpatient work-up.  Take antibiotics until completion, even if you are feeling better.  If you develop worsening pain, vomiting, fever, or any other new/concerning symptoms and return to the ER for evaluation.

## 2018-06-25 ENCOUNTER — Ambulatory Visit (HOSPITAL_COMMUNITY): Admission: RE | Admit: 2018-06-25 | Payer: No Typology Code available for payment source | Source: Ambulatory Visit

## 2018-06-25 LAB — URINE CULTURE: CULTURE: NO GROWTH

## 2018-06-30 ENCOUNTER — Encounter: Payer: Self-pay | Admitting: Hematology

## 2018-06-30 ENCOUNTER — Telehealth: Payer: Self-pay | Admitting: Hematology

## 2018-06-30 ENCOUNTER — Inpatient Hospital Stay: Payer: No Typology Code available for payment source | Attending: Hematology | Admitting: Hematology

## 2018-06-30 VITALS — BP 134/91 | HR 79 | Temp 98.1°F | Resp 18 | Ht 64.0 in | Wt 167.8 lb

## 2018-06-30 DIAGNOSIS — Z9049 Acquired absence of other specified parts of digestive tract: Secondary | ICD-10-CM | POA: Diagnosis not present

## 2018-06-30 DIAGNOSIS — Z803 Family history of malignant neoplasm of breast: Secondary | ICD-10-CM

## 2018-06-30 DIAGNOSIS — R1900 Intra-abdominal and pelvic swelling, mass and lump, unspecified site: Secondary | ICD-10-CM

## 2018-06-30 DIAGNOSIS — Z809 Family history of malignant neoplasm, unspecified: Secondary | ICD-10-CM

## 2018-06-30 DIAGNOSIS — Z975 Presence of (intrauterine) contraceptive device: Secondary | ICD-10-CM

## 2018-06-30 DIAGNOSIS — K6389 Other specified diseases of intestine: Secondary | ICD-10-CM

## 2018-06-30 DIAGNOSIS — N200 Calculus of kidney: Secondary | ICD-10-CM | POA: Diagnosis not present

## 2018-06-30 DIAGNOSIS — Z800 Family history of malignant neoplasm of digestive organs: Secondary | ICD-10-CM | POA: Diagnosis not present

## 2018-06-30 NOTE — Progress Notes (Signed)
HEMATOLOGY/ONCOLOGY CONSULTATION NOTE  Date of Service: 06/30/2018  Patient Care Team: Antony Contras, MD as PCP - General (Family Medicine)  CHIEF COMPLAINTS/PURPOSE OF CONSULTATION:  Mesenteric mass   HISTORY OF PRESENTING ILLNESS:   Carol Lawson is a wonderful 57 y.o. female who has been referred to [place] by Dr. 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 She is accompanied today by her husband. The pt reports that she is doing well overall.   The pt reports that she was violently throwing up in December of last year and was diagnosed with food poisoning, and her symptoms resolved after 24 hours. She notes that she was diagnosed with IBS-diarrhea about 20 years ago and has had colonoscopies and endoscopies, the last was normal and was performed 5 years ago. She notes that she has felt "great" since December 2018. She denies any abnormal bowel movements recently. The pt notes that she has lost 12 pounds in the last several months with the Keto diet and exercise. She denies any recent fevers and chills.   The pt notes that she woke up a week ago with back pain. She notes radiating pain into her abdomen and began throwing up for one day, which resolved on its own, then returned the following day. She was evaluated with a urine test with her PCP which showed blood in the urine and was told to go to the ED if she felt worse. She ended up presenting to the ED on 06/24/18. She had a CT Renal. She denies any concern for bowel obstruction. She began Zofran and Hydrocodone which have relieved her vomiting and addressed her pain.   The pt notes that she was on an estrogen replacing patch for a year and a half until February 2019. She currently has an IUD in place which was replaced in February 2018. The pt notes that she has had some night sweats but isn't sure if these are related to menopause or not. She denies any vaginal bleeding.   The pt denies any blood in the stools. The pt  has seen Dr. Earlean Shawl in GI historically.  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 She denies any other recent foreign travel . She has two dogs and denies any other animal exposure.   Of note prior to the patient's visit today, pt has had CT Renal Stone Study completed on 06/24/18 with results revealing Masslike haziness/opacity in the right central mesentery, progressed in size and density since prior study. 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 Prior cholecystectomy. Normal appendix.  The pt also had an MRI Abdomen completed on 06/25/18 which revealed Fatty/soft tissue density mass of the right abdominal mesentery. Given the focal appearance of this area, a mass is suspected such as lymphoma or liposarcoma. The mass surrounds the SMV and SMA with twisting, no associated occlusion.  Most recent lab results (06/24/18) of CBC and CMP is as follows: all values are WNL except for Glucose at 103, Calcium at 8.5.   On review of systems, pt reports recent flank pain radiating into the abdomen, normal bowel movements, recent vomiting and nausea, formed stools, and denies diarrhea, constipation, fevers, chills, unexpected weight loss, changes in urination, painful urination, changes in breathing, SOB, CP, noticing any other lumps or bumps, blood in the stools, injuries to the abdomen, pain when swallowing good,  leg swelling, new skin rashes, and any other symptoms.   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 Diagnosed with IBS-Diarrhea 20 years ago.  On Social Hx the pt reports minimal alcohol use, and denies ever smoking. She works as a Gaffer.  On Family Hx the pt reports mom with pancreatic cancer at age 1 and  breast cancer, paternal grandmother with some form of cancer and denies any other blood disease or cancer The pt notes that she doesn't have any known drug allergies.    MEDICAL HISTORY:  Past Medical History:  Diagnosis Date  . GERD (gastroesophageal reflux disease)   . Hypertension    not on medication    SURGICAL HISTORY: Past Surgical History:  Procedure Laterality Date  . ACL repair of knee  2006  . CHOLECYSTECTOMY  2006  . intestinal sx     age 73  . meniscus repair of knee  2007    SOCIAL HISTORY: Social History   Socioeconomic History  . Marital status: Married    Spouse name: Not on file  . Number of children: Not on file  . Years of education: Not on file  . Highest education level: Not on file  Occupational History  . Not on file  Social Needs  . Financial resource strain: Not on file  . Food insecurity:    Worry: Not on file    Inability: Not on file  . Transportation needs:    Medical: Not on file    Non-medical: Not on file  Tobacco Use  . Smoking status: Never Smoker  . Smokeless tobacco: Never Used  Substance and Sexual Activity  . Alcohol use: Yes    Comment: occasionally wine  . Drug use: No  . Sexual activity: Not on file  Lifestyle  . Physical activity:    Days per week: Not on file    Minutes per session: Not on file  . Stress: Not on file  Relationships  . Social connections:    Talks on phone: Not on file    Gets together: Not on file    Attends religious service: Not on file    Active member of club or organization: Not on file    Attends meetings of clubs or organizations: Not on file    Relationship status: Not on file  . Intimate partner violence:    Fear of current or ex partner: Not on file    Emotionally abused: Not on file    Physically abused: Not on file    Forced sexual activity: Not on file  Other Topics Concern  . Not on file  Social History Narrative  . Not on file    FAMILY HISTORY: Family History  Problem  Relation Age of Onset  . Breast cancer Mother   . Heart attack Father   . Heart disease Father     ALLERGIES:  has No Known Allergies.  MEDICATIONS:  Current Outpatient Medications  Medication Sig Dispense Refill  . amoxicillin-clavulanate (AUGMENTIN) 875-125 MG tablet Take 1 tablet by mouth 2 (two) times daily. 14 tablet 0  . HYDROcodone-acetaminophen (NORCO/VICODIN) 5-325 MG tablet Take 1 tablet by mouth every 6 (six) hours as needed for moderate pain.    Marland Kitchen ibuprofen (ADVIL,MOTRIN) 200 MG tablet Take 200 mg by mouth every 6 (six) hours as needed.    . ondansetron (ZOFRAN) 4  MG tablet Take 4 mg by mouth every 8 (eight) hours as needed for nausea or vomiting.     No current facility-administered medications for this visit.     REVIEW OF SYSTEMS:    10 Point review of Systems was done is negative except as noted above.  PHYSICAL EXAMINATION: ECOG PERFORMANCE STATUS: 1 - Symptomatic but completely ambulatory  . Vitals:   06/30/18 1535  BP: (!) 134/91  Pulse: 79  Resp: 18  Temp: 98.1 F (36.7 C)  SpO2: 98%   Filed Weights   06/30/18 1535  Weight: 167 lb 12.8 oz (76.1 kg)   .Body mass index is 28.8 kg/m.  GENERAL:alert, in no acute distress and comfortable SKIN: no acute rashes, no significant lesions EYES: conjunctiva are pink and non-injected, sclera anicteric OROPHARYNX: MMM, no exudates, no oropharyngeal erythema or ulceration NECK: supple, no JVD LYMPH:  no palpable lymphadenopathy in the cervical, axillary or inguinal regions LUNGS: clear to auscultation b/l with normal respiratory effort HEART: regular rate & rhythm ABDOMEN:  normoactive bowel sounds , mild tenderness right mid to lower abdomen, not distended. Extremity: no pedal edema PSYCH: alert & oriented x 3 with fluent speech NEURO: no focal motor/sensory deficits  LABORATORY DATA:  I have reviewed the data as listed  . CBC Latest Ref Rng & Units 06/24/2018 10/07/2017  WBC 4.0 - 10.5 K/uL 8.2  14.9(H)  Hemoglobin 12.0 - 15.0 g/dL 14.8 15.7(H)  Hematocrit 36.0 - 46.0 % 42.5 45.8  Platelets 150 - 400 K/uL 164 213    . CMP Latest Ref Rng & Units 06/24/2018 10/07/2017 10/20/2013  Glucose 70 - 99 mg/dL 103(H) 113(H) 111(H)  BUN 6 - 20 mg/dL 18 13 12   Creatinine 0.44 - 1.00 mg/dL 0.80 0.68 0.9  Sodium 135 - 145 mmol/L 137 136 140  Potassium 3.5 - 5.1 mmol/L 3.7 3.7 4.1  Chloride 98 - 111 mmol/L 102 103 103  CO2 22 - 32 mmol/L 24 24 30   Calcium 8.9 - 10.3 mg/dL 8.5(L) 9.5 9.1  Total Protein 6.5 - 8.1 g/dL 7.0 8.1 -  Total Bilirubin 0.3 - 1.2 mg/dL 0.9 0.6 -  Alkaline Phos 38 - 126 U/L 69 89 -  AST 15 - 41 U/L 17 32 -  ALT 0 - 44 U/L 22 48 -     RADIOGRAPHIC STUDIES: I have personally reviewed the radiological images as listed and agreed with the findings in the report.  06/25/18 MRI Abdomen:   Ct Renal Stone Study  Result Date: 06/24/2018 CLINICAL DATA:  Hematuria EXAM: CT ABDOMEN AND PELVIS WITHOUT CONTRAST TECHNIQUE: Multidetector CT imaging of the abdomen and pelvis was performed following the standard protocol without IV contrast. COMPARISON:  10/08/2017 FINDINGS: Lower chest: Lung bases are clear. No effusions. Heart is normal size. Hepatobiliary: No focal liver abnormality is seen. Status post cholecystectomy. No biliary dilatation. Pancreas: No focal abnormality or ductal dilatation. Spleen: No focal abnormality.  Normal size. Adrenals/Urinary Tract: No adrenal abnormality. No focal renal abnormality. No stones or hydronephrosis. Urinary bladder is unremarkable. Stomach/Bowel: Normal appendix. Stomach, large and small bowel grossly unremarkable. Vascular/Lymphatic: No aneurysm. Ill-defined soft tissue mass in the right mesentery measures up to 6 cm. Reproductive: IUD noted in the uterus. Uterus and adnexa unremarkable. No mass. Other: No free fluid or free air. Musculoskeletal: No acute bony abnormality. IMPRESSION: Masslike haziness/opacity in the right central mesentery,  progressed in size and density since prior study. While this could reflect mesenteric adenitis, the masslike appearance is concerning for  possible mesenteric lymphoma or other neoplastic process. Prior cholecystectomy. Normal appendix. Electronically Signed   By: Rolm Baptise M.D.   On: 06/24/2018 07:14    ASSESSMENT & PLAN:  57 y.o. female with  1. Mesenteric mass - rt central mesentry. PLAN -Discussed patient's most recent labs from 06/24/18, blood counts were all normal -Discussed the 10/08/17 CT Abdomen/Pelvis which revealed Mild wall thickening along the distal ileum, with associated mesenteric edema, concerning for infectious or inflammatory ileitis. Additional vague inflammation noted tracking proximally along the mesentery. -Discussed the 06/24/18 CT Renal Stone Study without contrast which revealed Masslike haziness/opacity in the right central mesentery, progressed in size and density since prior study. 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 Prior cholecystectomy. Normal appendix. -Discussed the 06/25/18 MRI Abdomen which revealed Fatty/soft tissue density mass of the right abdominal mesentery. Given the focal appearance of this area, a mass is suspected such as lymphoma or liposarcoma. The mass surrounds the SMV and SMA with twisting, no associated occlusion. -Will refill Phenergan and Zofran  -Will refer pt to a surgeon to establish care -GI Dr. Earlean Shawl   -Will order PET/CT  -Will order blood tests and tumor markers as noted below -Will consider biopsy options after PET/CT scan and will discuss with IR -Will see the pt back in 2 weeks   . Orders Placed This Encounter  Procedures  . 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    Answer:   No    Order Specific Question:   Preferred imaging location?    Answer:   [facility]    Order Specific Question:   Radiology Contrast Protocol - do NOT remove file path    Answer:   [email or web address]  . CT Biopsy    Standing Status:   Future    Standing Expiration Date:   06/30/2019    Order Specific Question:   Lab orders requested (DO NOT place separate lab orders, these will be automatically ordered during procedure specimen collection):    Answer:   Surgical Pathology    Comments:   flow cytometry    Order Specific Question:   Reason for Exam (SYMPTOM  OR DIAGNOSIS REQUIRED)    Answer:   Rt central mesentric mass concerning for possible lymphoma vs mesenteric metastases for core needle biopsy for diagnosis    Order Specific Question:   Is patient pregnant?    Answer:   No    Order Specific Question:   Preferred imaging location?    Answer:   [facility]    Order Specific Question:   Radiology Contrast Protocol - do NOT remove file path    Answer:   \\[email or web address]  . CBC with Differential/Platelet    Standing Status:   Future    Number of Occurrences:   1    Standing Expiration Date:   08/04/2019  .  CMP ([facility] only)    Standing Status:   Future    Number of Occurrences:   1    Standing Expiration Date:   07/01/2019  . Lactate dehydrogenase    Standing Status:   Future    Number of Occurrences:   1    Standing Expiration Date:   07/01/2019  . CA 125    Standing Status:   Future    Number of Occurrences:   1    Standing Expiration Date:   06/30/2019  .  Cancer antigen 19-9    Standing Status:   Future    Number of Occurrences:   1    Standing Expiration Date:   08/04/2019  . CEA (IN HOUSE-[facility])    Standing Status:   Future    Number of Occurrences:   1    Standing Expiration Date:   07/01/2019  . AFP tumor marker    Standing Status:   Future    Number of Occurrences:   1    Standing Expiration Date:   06/30/2019  . Chromogranin A    Standing Status:   Future    Number of Occurrences:   1    Standing Expiration Date:   06/30/2019  . Ambulatory referral to General Surgery    Referral Priority:   Urgent    Referral Type:   Surgical    Referral Reason:   Specialty Services Required    Referred to Provider:   Stark Klein, MD    Requested Specialty:   General Surgery    Number of Visits Requested:   1     Labs tomorrow AM PET/CT in 5-7 days CT guided biopsy of rt mesenteric mass ?lymphoma vs other tumor in 3-4 days General surgery referral RTC with Dr Irene Limbo in 2 weeks  2) . Patient Active Problem List   Diagnosis Date Noted  . Hypersomnia 10/20/2013  . Headache 10/20/2013  . HTN (hypertension) 10/04/2013  . Chest pain 10/04/2013  - f/u with PCP for other medical co-morbidities  All of the patients questions were answered with apparent satisfaction. The patient knows to call the clinic with any problems, questions or concerns.  The total time spent in the appt was 65 minutes and more than 50% was on counseling and direct patient cares.    Sullivan Lone MD MS AAHIVMS [facility] Hematology/Oncology Physician [facility]  (Office):       [phone] (Work cell):  [phone] (Fax):           [phone]  06/30/2018 4:49 PM  I, Baldwin [place], am acting as a scribe for Dr. Irene Limbo  .I have reviewed the above documentation for accuracy and completeness, and I agree with the above. Brunetta Genera MD

## 2018-06-30 NOTE — Telephone Encounter (Signed)
Pt has been called and scheduled to see Dr. Irene Limbo today at 320pm. Pt aware to arrive early to be checked in on time.

## 2018-06-30 NOTE — Telephone Encounter (Signed)
Appts scheduled avs/calendar printed / referral place per 9/18 los

## 2018-07-01 ENCOUNTER — Ambulatory Visit
Admission: RE | Admit: 2018-07-01 | Discharge: 2018-07-01 | Disposition: A | Discharge status: Home / Self Care | Payer: Self-pay | Source: Ambulatory Visit | Attending: Hematology | Admitting: Hematology

## 2018-07-01 ENCOUNTER — Other Ambulatory Visit: Payer: Self-pay | Admitting: Hematology

## 2018-07-01 ENCOUNTER — Telehealth: Payer: Self-pay

## 2018-07-01 ENCOUNTER — Inpatient Hospital Stay: Payer: No Typology Code available for payment source

## 2018-07-01 DIAGNOSIS — K6389 Other specified diseases of intestine: Secondary | ICD-10-CM

## 2018-07-01 DIAGNOSIS — C801 Malignant (primary) neoplasm, unspecified: Secondary | ICD-10-CM

## 2018-07-01 DIAGNOSIS — R1900 Intra-abdominal and pelvic swelling, mass and lump, unspecified site: Secondary | ICD-10-CM | POA: Diagnosis not present

## 2018-07-01 LAB — CMP (CANCER CENTER ONLY)
ALT: 45 U/L — ABNORMAL HIGH (ref 0–44)
AST: 29 U/L (ref 15–41)
Albumin: 3.8 g/dL (ref 3.5–5.0)
Alkaline Phosphatase: 77 U/L (ref 38–126)
Anion gap: 6 (ref 5–15)
BILIRUBIN TOTAL: 0.4 mg/dL (ref 0.3–1.2)
BUN: 14 mg/dL (ref 6–20)
CO2: 30 mmol/L (ref 22–32)
CREATININE: 0.78 mg/dL (ref 0.44–1.00)
Calcium: 8.8 mg/dL — ABNORMAL LOW (ref 8.9–10.3)
Chloride: 105 mmol/L (ref 98–111)
GFR, Est AFR Am: >60 mL/min (ref >60)
Glucose, Bld: 95 mg/dL (ref 70–99)
POTASSIUM: 3.9 mmol/L (ref 3.5–5.1)
Sodium: 141 mmol/L (ref 135–145)
TOTAL PROTEIN: 7 g/dL (ref 6.5–8.1)

## 2018-07-01 LAB — CBC WITH DIFFERENTIAL/PLATELET
BASOS ABS: 0 10*3/uL (ref 0.0–0.1)
Basophils Relative: 1 %
Eosinophils Absolute: 0.1 10*3/uL (ref 0.0–0.5)
Eosinophils Relative: 2 %
HEMATOCRIT: 39.8 % (ref 34.8–46.6)
HEMOGLOBIN: 13.6 g/dL (ref 11.6–15.9)
LYMPHS PCT: 27 %
Lymphs Abs: 1.1 10*3/uL (ref 0.9–3.3)
MCH: 30.6 pg (ref 25.1–34.0)
MCHC: 34.1 g/dL (ref 31.5–36.0)
MCV: 89.8 fL (ref 79.5–101.0)
MONO ABS: 0.4 10*3/uL (ref 0.1–0.9)
MONOS PCT: 8 %
NEUTROS ABS: 2.6 10*3/uL (ref 1.5–6.5)
NEUTROS PCT: 62 %
Platelets: 158 10*3/uL (ref 145–400)
RBC: 4.44 MIL/uL (ref 3.70–5.45)
RDW: 12.6 % (ref 11.2–14.5)
WBC: 4.3 10*3/uL (ref 3.9–10.3)

## 2018-07-01 LAB — CEA (IN HOUSE-CHCC): CEA (CHCC-IN HOUSE): 2.19 ng/mL (ref 0.00–5.00)

## 2018-07-01 LAB — LACTATE DEHYDROGENASE: LDH: 181 U/L (ref 98–192)

## 2018-07-01 NOTE — Telephone Encounter (Signed)
Disk containing scans given to [facility] in Radiology to scan into Epic. Labeled with patient's name and MRN and phone number so that patient can be called to pick up disk once scanned.

## 2018-07-02 LAB — CANCER ANTIGEN 19-9: CAN 19-9: 11 U/mL (ref 0–35)

## 2018-07-02 LAB — AFP TUMOR MARKER: AFP, Serum, Tumor Marker: 1.9 ng/mL (ref 0.0–8.3)

## 2018-07-02 LAB — CHROMOGRANIN A: Chromogranin A: 2 nmol/L (ref 0–5)

## 2018-07-02 LAB — CA 125: Cancer Antigen (CA) 125: 9.4 U/mL (ref 0.0–38.1)

## 2018-07-05 MED ORDER — ONDANSETRON HCL 4 MG PO TABS: 4.0000 mg | ORAL_TABLET | Freq: Three times a day (TID) | ORAL | 1 refills | Status: AC | PRN

## 2018-07-06 ENCOUNTER — Encounter: Payer: Self-pay | Admitting: Hematology

## 2018-07-07 ENCOUNTER — Telehealth: Payer: Self-pay

## 2018-07-07 NOTE — Telephone Encounter (Signed)
Spoke with Vivien Rota, Biopsy Scheduler, to discuss appt for pt. At this time, appt not to be scheduled per reviewing physician until PET scan results are received. Based on MRI there is not currently anything that can be biopsied. In-basket sent to Delle Reining, RN and Dr. Irene Limbo who will f/u next week. PET scheduled for 07/16/18.

## 2018-07-07 NOTE — Telephone Encounter (Signed)
My Chart response to pt regarding imaging and lab work:  Hello Ms. Chieffo,  Per Dr. Irene Limbo, your cancer antigen and tumor marker tests have not shown anything to help him determine potential origin of the mass. Dr. Irene Limbo read the MRI report, which doesn't change anything right now. Still on schedule to have PET 07/16/18. After completion, physician will review to determine location of biopsy. Called to schedule, but physician reviewing would like to wait to create appointment until location is determined. Please let us know if you have any further questions.  Thank you,  Aldona Bar, RN

## 2018-07-16 ENCOUNTER — Ambulatory Visit (HOSPITAL_COMMUNITY)
Admission: RE | Admit: 2018-07-16 | Discharge: 2018-07-16 | Disposition: A | Discharge status: Home / Self Care | Payer: No Typology Code available for payment source | Source: Ambulatory Visit | Attending: Hematology | Admitting: Hematology

## 2018-07-16 DIAGNOSIS — K6389 Other specified diseases of intestine: Secondary | ICD-10-CM | POA: Insufficient documentation

## 2018-07-16 DIAGNOSIS — I898 Other specified noninfective disorders of lymphatic vessels and lymph nodes: Secondary | ICD-10-CM | POA: Insufficient documentation

## 2018-07-16 LAB — GLUCOSE, CAPILLARY: Glucose-Capillary: 98 mg/dL (ref 70–99)

## 2018-07-16 MED ORDER — FLUDEOXYGLUCOSE F - 18 (FDG) INJECTION
8.4000 | Freq: Once | INTRAVENOUS | Status: AC | PRN
  Administered 2018-07-16: 8.4 via INTRAVENOUS

## 2018-07-19 ENCOUNTER — Encounter: Payer: Self-pay | Admitting: Hematology

## 2018-07-20 ENCOUNTER — Telehealth: Payer: Self-pay

## 2018-07-20 ENCOUNTER — Encounter: Payer: Self-pay | Admitting: Hematology

## 2018-07-20 ENCOUNTER — Inpatient Hospital Stay: Payer: No Typology Code available for payment source | Attending: Hematology | Admitting: Hematology

## 2018-07-20 VITALS — BP 133/82 | HR 81 | Temp 98.0°F | Resp 18 | Ht 64.0 in | Wt 165.2 lb

## 2018-07-20 DIAGNOSIS — K580 Irritable bowel syndrome with diarrhea: Secondary | ICD-10-CM | POA: Diagnosis not present

## 2018-07-20 DIAGNOSIS — Z800 Family history of malignant neoplasm of digestive organs: Secondary | ICD-10-CM

## 2018-07-20 DIAGNOSIS — Z9049 Acquired absence of other specified parts of digestive tract: Secondary | ICD-10-CM

## 2018-07-20 DIAGNOSIS — Z79899 Other long term (current) drug therapy: Secondary | ICD-10-CM | POA: Diagnosis not present

## 2018-07-20 DIAGNOSIS — I10 Essential (primary) hypertension: Secondary | ICD-10-CM

## 2018-07-20 DIAGNOSIS — R1907 Generalized intra-abdominal and pelvic swelling, mass and lump: Secondary | ICD-10-CM

## 2018-07-20 DIAGNOSIS — D484 Neoplasm of uncertain behavior of peritoneum: Secondary | ICD-10-CM

## 2018-07-20 NOTE — Telephone Encounter (Signed)
Printed avs and calender of upcoming appointment. Per 10/8 los.  

## 2018-07-20 NOTE — Progress Notes (Signed)
HEMATOLOGY/ONCOLOGY CONSULTATION NOTE  Date of Service: 07/20/2018  Patient Care Team: Antony Contras, MD as PCP - General (Family Medicine)  CHIEF COMPLAINTS/PURPOSE OF CONSULTATION:  Mesenteric mass   HISTORY OF PRESENTING ILLNESS:   Carol Lawson is a wonderful 57 y.o. female who has been referred to [place] by Dr. 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 She is accompanied today by her husband. The pt reports that she is doing well overall.   The pt reports that she was violently throwing up in December of last year and was diagnosed with food poisoning, and her symptoms resolved after 24 hours. She notes that she was diagnosed with IBS-diarrhea about 20 years ago and has had colonoscopies and endoscopies, the last was normal and was performed 5 years ago. She notes that she has felt "great" since December 2018. She denies any abnormal bowel movements recently. The pt notes that she has lost 12 pounds in the last several months with the Keto diet and exercise. She denies any recent fevers and chills.   The pt notes that she woke up a week ago with back pain. She notes radiating pain into her abdomen and began throwing up for one day, which resolved on its own, then returned the following day. She was evaluated with a urine test with her PCP which showed blood in the urine and was told to go to the ED if she felt worse. She ended up presenting to the ED on 06/24/18. She had a CT Renal. She denies any concern for bowel obstruction. She began Zofran and Hydrocodone which have relieved her vomiting and addressed her pain.   The pt notes that she was on an estrogen replacing patch for a year and a half until February 2019. She currently has an IUD in place which was replaced in February 2018. The pt notes that she has had some night sweats but isn't sure if these are related to menopause or not. She denies any vaginal bleeding.   The pt denies any blood in the stools. The pt  has seen Dr. Earlean Shawl in GI historically.  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 She denies any other recent foreign travel . She has two dogs and denies any other animal exposure.   Of note prior to the patient's visit today, pt has had CT Renal Stone Study completed on 06/24/18 with results revealing Masslike haziness/opacity in the right central mesentery, progressed in size and density since prior study. 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 Prior cholecystectomy. Normal appendix.  The pt also had an MRI Abdomen completed on 06/25/18 which revealed Fatty/soft tissue density mass of the right abdominal mesentery. Given the focal appearance of this area, a mass is suspected such as lymphoma or liposarcoma. The mass surrounds the SMV and SMA with twisting, no associated occlusion.  Most recent lab results (06/24/18) of CBC and CMP is as follows: all values are WNL except for Glucose at 103, Calcium at 8.5.   On review of systems, pt reports recent flank pain radiating into the abdomen, normal bowel movements, recent vomiting and nausea, formed stools, and denies diarrhea, constipation, fevers, chills, unexpected weight loss, changes in urination, painful urination, changes in breathing, SOB, CP, noticing any other lumps or bumps, blood in the stools, injuries to the abdomen, pain when swallowing good,  leg swelling, new skin rashes, and any other symptoms.   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 Diagnosed with IBS-Diarrhea 20 years ago.  On Social Hx the pt reports minimal alcohol use, and denies ever smoking. She works as a Gaffer.  On Family Hx the pt reports mom with pancreatic cancer at age 35 and  breast cancer, paternal grandmother with some form of cancer and denies any other blood disease or cancer The pt notes that she doesn't have any known drug allergies.  Interval History:   Carol Lawson returns today for management and evaluation of her mesenteric mass. The patient's last visit with [place] was on 06/30/18. She is accompanied today by her husband. The pt reports that she is doing well overall.   The pt reports that she has continued to have constant abdominal pains that require her to take half of 325mg  Hydrocodone and 200mg  Ibuprofen every 6 hours. She notes that she has an appointment with surgery on 08/02/18. She notes that after eating her abdominal pain is a little worse.  She denies anything alleviating her pain, including having a bowel movement. She notes that she has had fluctuating bowel habits, between constipation and diarrhea.   The pt denies any discomfort while urinating.   The pt had a colonoscopy four years ago, and an endoscopy 10 years ago which were normal, and she denies having pancreatitis in the past. She denies sustaining any trauma to the abdomen. She denies any other associations.   The pt had a tumor removed when she was 57 years old from her large intestine with removal of some of her bowel.   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 Possibilities include mesenteric panniculitis, idiopathic mesenteric stranding, or low-grade mesenteric lymphoma. The liver appears normal and accordingly cirrhosis is not implicated as a possible cause. Given the pattern surrounding the mesenteric vessels, liposarcoma although not impossible would seem to be very unlikely. 2. There is a small left level IIa lymph node in the neck which has low-grade activity with  maximum SUV 4.5, probably reactive/incidental.  Lab results (07/01/18) of CBC w/diff, CMP, and Reticulocytes is as follows: all values are WNL except for Calcium at 8.8, ALT at 45.  On review of systems, pt reports constant upper abdominal pain, fluctuating bowel movements, stable energy levels, and denies lower abdominal pains, pain when urinating, flank pain, leg swelling, and any other symptoms.   MEDICAL HISTORY:  Past Medical History:  Diagnosis Date  . GERD (gastroesophageal reflux disease)   . Hypertension    not on medication    SURGICAL HISTORY: Past Surgical History:  Procedure Laterality Date  . ACL repair of knee  2006  . CHOLECYSTECTOMY  2006  . intestinal sx     age 71  . meniscus repair of knee  2007    SOCIAL HISTORY: Social History   Socioeconomic History  . Marital status: Married    Spouse name: Not on file  . Number of children: Not on file  . Years of education: Not on file  . Highest education level: Not on file  Occupational History  . Not on file  Social Needs  . Financial resource strain: Not on file  . Food insecurity:    Worry: Not on file    Inability: Not on file  . Transportation needs:    Medical: Not on file    Non-medical: Not on file  Tobacco Use  . Smoking status: Never Smoker  . Smokeless tobacco: Never Used  Substance and Sexual Activity  . Alcohol use: Yes    Comment: occasionally wine  . Drug use: No  . Sexual activity: Not on file  Lifestyle  . Physical activity:    Days per week: Not on file    Minutes per session: Not on file  . Stress: Not on file  Relationships  . Social connections:    Talks on phone: Not on file    Gets together: Not on file    Attends religious service: Not on file    Active member of club or organization: Not on file    Attends meetings of clubs or organizations: Not on file    Relationship status: Not on file  . Intimate partner violence:    Fear of current or ex partner: Not on file     Emotionally abused: Not on file    Physically abused: Not on file    Forced sexual activity: Not on file  Other Topics Concern  . Not on file  Social History Narrative  . Not on file    FAMILY HISTORY: Family History  Problem Relation Age of Onset  . Breast cancer Mother   . Heart attack Father   . Heart disease Father     ALLERGIES:  has No Known Allergies.  MEDICATIONS:  Current Outpatient Medications  Medication Sig Dispense Refill  . HYDROcodone-acetaminophen (NORCO/VICODIN) 5-325 MG tablet Take 1 tablet by mouth every 6 (six) hours as needed for moderate pain.    Marland Kitchen ibuprofen (ADVIL,MOTRIN) 200 MG tablet Take 200 mg by mouth every 6 (six) hours as needed.    . ondansetron (ZOFRAN) 4 MG tablet Take 1 tablet (4 mg total) by mouth every 8 (eight) hours as needed for nausea or vomiting. 30 tablet 1  . OVER THE COUNTER MEDICATION Place 3 drops under the tongue at bedtime. CBD Oil    . promethazine (PHENERGAN) 25 MG tablet Take 25 mg by mouth every 6 (six) hours as needed for nausea or vomiting.      No current facility-administered medications for this visit.     REVIEW OF SYSTEMS:    A 10+ POINT REVIEW OF SYSTEMS WAS OBTAINED including neurology, dermatology, psychiatry, cardiac, respiratory, lymph, extremities, GI, GU, Musculoskeletal, constitutional, breasts, reproductive, HEENT.  All pertinent positives are noted in the HPI.  All others are negative.   PHYSICAL EXAMINATION: ECOG PERFORMANCE STATUS: 1 - Symptomatic but completely ambulatory  . Vitals:   07/20/18 1449  BP: 133/82  Pulse: 81  Resp: 18  Temp: 98 F (36.7 C)  SpO2: 100%   Filed Weights   07/20/18 1449  Weight: 165 lb 3.2 oz (74.9 kg)   .Body mass index is 28.36 kg/m.  GENERAL:alert, in no acute distress and comfortable SKIN: no acute rashes, no significant lesions EYES: conjunctiva are pink and non-injected, sclera anicteric OROPHARYNX: MMM, no exudates, no oropharyngeal erythema or  ulceration NECK: supple, no JVD LYMPH:  no palpable lymphadenopathy in the cervical, axillary or inguinal regions LUNGS: clear to auscultation b/l with normal respiratory effort HEART: regular rate & rhythm ABDOMEN:  normoactive bowel sounds , mild tenderness right mid  to lower abdomen, not distended. No palpable hepatosplenomegaly.  Extremity: no pedal edema PSYCH: alert & oriented x 3 with fluent speech NEURO: no focal motor/sensory deficits   LABORATORY DATA:  I have reviewed the data as listed  . CBC Latest Ref Rng & Units 07/01/2018 06/24/2018 10/07/2017  WBC 3.9 - 10.3 K/uL 4.3 8.2 14.9(H)  Hemoglobin 11.6 - 15.9 g/dL 13.6 14.8 15.7(H)  Hematocrit 34.8 - 46.6 % 39.8 42.5 45.8  Platelets 145 - 400 K/uL 158 164 213    . CMP Latest Ref Rng & Units 07/01/2018 06/24/2018 10/07/2017  Glucose 70 - 99 mg/dL 95 103(H) 113(H)  BUN 6 - 20 mg/dL 14 18 13   Creatinine 0.44 - 1.00 mg/dL 0.78 0.80 0.68  Sodium 135 - 145 mmol/L 141 137 136  Potassium 3.5 - 5.1 mmol/L 3.9 3.7 3.7  Chloride 98 - 111 mmol/L 105 102 103  CO2 22 - 32 mmol/L 30 24 24   Calcium 8.9 - 10.3 mg/dL 8.8(L) 8.5(L) 9.5  Total Protein 6.5 - 8.1 g/dL 7.0 7.0 8.1  Total Bilirubin 0.3 - 1.2 mg/dL 0.4 0.9 0.6  Alkaline Phos 38 - 126 U/L 77 69 89  AST 15 - 41 U/L 29 17 32  ALT 0 - 44 U/L 45(H) 22 48     RADIOGRAPHIC STUDIES: I have personally reviewed the radiological images as listed and agreed with the findings in the report.  06/25/18 MRI Abdomen:   Nm Pet Image Initial (pi) Skull Base To Thigh  Result Date: 07/16/2018 CLINICAL DATA:  Initial treatment strategy for mesenteric mass. EXAM: NUCLEAR MEDICINE PET SKULL BASE TO THIGH TECHNIQUE: 8.4 mCi F-18 FDG was injected intravenously. Full-ring PET imaging was performed from the skull base to thigh after the radiotracer. CT data was obtained and used for attenuation correction and anatomic localization. Fasting blood glucose: 98 mg/dl COMPARISON:  Multiple exams,  including MRI abdomen from 06/25/2018 and CT abdomen from 06/24/2018 FINDINGS: Mediastinal blood pool activity: SUV max 2.1 NECK: Palatine tonsillar activity 6.1 on the right and 6.6 on the left. A left level IIa lymph node on image 27/4 measures 7 mm in diameter and has a maximum SUV of 4.5. No pathologically enlarged adenopathy in the neck and no other accentuated nodal activity in the neck. Incidental CT findings: none CHEST: No significant abnormal hypermetabolic activity in this region. Incidental CT findings: none ABDOMEN/PELVIS: The abnormal stranding along the mesentery has faintly accentuated metabolic activity, maximum SUV 1.9. No splenomegaly noted. Incidental CT findings: Cholecystectomy. Suture material from prior laparotomy. An IUD appears satisfactorily positioned in the uterus. SKELETON: No significant abnormal hypermetabolic activity in this region. Incidental CT findings: Small sclerotic lesion posteriorly in the right femoral neck is probably a bone island and has no accentuated metabolic activity. IMPRESSION: 1. 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 Possibilities include mesenteric panniculitis, idiopathic mesenteric stranding, or low-grade mesenteric lymphoma. The liver appears normal and accordingly cirrhosis is not implicated as a possible cause. Given the pattern surrounding the mesenteric vessels, liposarcoma although not impossible would seem to be very unlikely. 2. There is a small left level IIa lymph node in the neck which has low-grade activity with maximum SUV 4.5, probably reactive/incidental. Electronically Signed   By: Van Clines M.D.   On: 07/16/2018 15:40   Ct Renal Stone Study  Result Date: 06/24/2018 CLINICAL DATA:  Hematuria EXAM: CT ABDOMEN AND PELVIS  WITHOUT CONTRAST TECHNIQUE: Multidetector CT imaging of the abdomen and pelvis was performed  following the standard protocol without IV contrast. COMPARISON:  10/08/2017 FINDINGS: Lower chest: Lung bases are clear. No effusions. Heart is normal size. Hepatobiliary: No focal liver abnormality is seen. Status post cholecystectomy. No biliary dilatation. Pancreas: No focal abnormality or ductal dilatation. Spleen: No focal abnormality.  Normal size. Adrenals/Urinary Tract: No adrenal abnormality. No focal renal abnormality. No stones or hydronephrosis. Urinary bladder is unremarkable. Stomach/Bowel: Normal appendix. Stomach, large and small bowel grossly unremarkable. Vascular/Lymphatic: No aneurysm. Ill-defined soft tissue mass in the right mesentery measures up to 6 cm. Reproductive: IUD noted in the uterus. Uterus and adnexa unremarkable. No mass. Other: No free fluid or free air. Musculoskeletal: No acute bony abnormality. IMPRESSION: Masslike haziness/opacity in the right central mesentery, progressed in size and density since prior study. 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 Prior cholecystectomy. Normal appendix. Electronically Signed   By: Rolm Baptise M.D.   On: 06/24/2018 07:14   Mr [facility] [employer] Spine  Result Date: 07/01/2018 This examination belongs to an outside facility and is stored here for comparison purposes only.  Contact the originating outside institution for any associated report or interpretation.   ASSESSMENT & PLAN:  57 y.o. female with  1. Mesenteric mass - rt central mesentry.  10/08/17 CT Abdomen/Pelvis revealed Mild wall thickening along the distal ileum, with associated mesenteric edema, concerning for infectious or inflammatory ileitis. Additional vague inflammation noted tracking proximally along the mesentery.  06/24/18 CT Renal Stone Study without contrast revealed Masslike haziness/opacity in the right central mesentery, progressed in size and density  since prior study. 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 Prior cholecystectomy. Normal appendix.   06/25/18 MRI Abdomen revealed Fatty/soft tissue density mass of the right abdominal mesentery. Given the focal appearance of this area, a mass is suspected such as lymphoma or liposarcoma. The mass surrounds the SMV and SMA with twisting, no associated occlusion.   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 Possibilities include mesenteric panniculitis, idiopathic mesenteric stranding, or low-grade mesenteric lymphoma. The liver appears normal and accordingly cirrhosis is not implicated as a possible cause. Given the pattern surrounding the mesenteric vessels, liposarcoma although not impossible would seem to be very unlikely. 2. There is a small left level IIa lymph node in the neck which has low-grade activity with maximum SUV 4.5, probably reactive/incidental. -Discussed the tumor marker work up from 07/01/18 which were unrevealing. AFP normal at 1.9, CEA normal at 2.19, CA 19-9 normal at 11, CA 125 normal at 9.4. LDH and Chromogranin A were also WNL.  -Proceed with CT biopsy as scheduled for 07/23/18  -Discussed that there may be a possibility that nothing is able to be biopsied given the PET/CT findings, and if so we will continue to monitor the pt with imaging  -Continue with surgery consult with Dr. Barry Dienes on 08/02/18 -Would defer question of repeat endoscopy to GI  -Suggested that pt could try malox as well -Will see the pt back 2 months with labs   . Orders Placed This Encounter  Procedures  . CBC  with Differential/Platelet     Standing Status:   Future    Standing Expiration Date:   08/24/2019  . CMP ([place] only)    Standing Status:   Future    Standing Expiration Date:   07/21/2019     2) . Patient Active Problem List   Diagnosis Date Noted  . Hypersomnia 10/20/2013  . Headache 10/20/2013  . HTN (hypertension) 10/04/2013  . Chest pain 10/04/2013  - f/u with PCP for other medical co-morbidities   RTC with Dr Irene Limbo in 2 months with labs.   All of the patients questions were answered with apparent satisfaction. The patient knows to call the clinic with any problems, questions or concerns.  The total time spent in the appt was 25 minutes and more than 50% was on counseling and direct patient cares.     Sullivan Lone MD MS AAHIVMS [facility] Hematology/Oncology Physician [facility]  (Office):       [phone] (Work cell):  [phone] (Fax):           [phone]  07/20/2018 4:24 PM  I, Baldwin [place], am acting as a scribe for Dr. Irene Limbo  .I have reviewed the above documentation for accuracy and completeness, and I agree with the above. Brunetta Genera MD

## 2018-07-21 ENCOUNTER — Other Ambulatory Visit: Payer: Self-pay | Admitting: Radiology

## 2018-07-22 ENCOUNTER — Other Ambulatory Visit: Payer: Self-pay | Admitting: Student

## 2018-07-23 ENCOUNTER — Encounter (HOSPITAL_COMMUNITY): Payer: Self-pay

## 2018-07-23 ENCOUNTER — Ambulatory Visit (HOSPITAL_COMMUNITY)
Admission: RE | Admit: 2018-07-23 | Discharge: 2018-07-23 | Disposition: A | Discharge status: Home / Self Care | Payer: No Typology Code available for payment source | Source: Ambulatory Visit | Attending: Hematology | Admitting: Hematology

## 2018-07-23 ENCOUNTER — Other Ambulatory Visit: Payer: Self-pay

## 2018-07-23 DIAGNOSIS — K6389 Other specified diseases of intestine: Secondary | ICD-10-CM | POA: Diagnosis present

## 2018-07-23 DIAGNOSIS — I10 Essential (primary) hypertension: Secondary | ICD-10-CM | POA: Insufficient documentation

## 2018-07-23 DIAGNOSIS — K654 Sclerosing mesenteritis: Secondary | ICD-10-CM | POA: Diagnosis not present

## 2018-07-23 DIAGNOSIS — Z79899 Other long term (current) drug therapy: Secondary | ICD-10-CM | POA: Insufficient documentation

## 2018-07-23 DIAGNOSIS — R319 Hematuria, unspecified: Secondary | ICD-10-CM | POA: Diagnosis not present

## 2018-07-23 LAB — CBC WITH DIFFERENTIAL/PLATELET
Abs Immature Granulocytes: 0.02 10*3/uL (ref 0.00–0.07)
BASOS ABS: 0.1 10*3/uL (ref 0.0–0.1)
Basophils Relative: 1 %
EOS ABS: 0.1 10*3/uL (ref 0.0–0.5)
Eosinophils Relative: 3 %
HEMATOCRIT: 40 % (ref 36.0–46.0)
Hemoglobin: 12.9 g/dL (ref 12.0–15.0)
IMMATURE GRANULOCYTES: 0 %
Lymphocytes Relative: 28 %
Lymphs Abs: 1.4 10*3/uL (ref 0.7–4.0)
MCH: 29.2 pg (ref 26.0–34.0)
MCHC: 32.3 g/dL (ref 30.0–36.0)
MCV: 90.5 fL (ref 80.0–100.0)
MONO ABS: 0.5 10*3/uL (ref 0.1–1.0)
MONOS PCT: 11 %
Neutro Abs: 2.8 10*3/uL (ref 1.7–7.7)
Neutrophils Relative %: 57 %
Platelets: 170 10*3/uL (ref 150–400)
RBC: 4.42 MIL/uL (ref 3.87–5.11)
RDW: 12 % (ref 11.5–15.5)
WBC: 4.9 10*3/uL (ref 4.0–10.5)
nRBC: 0 % (ref 0.0–0.2)

## 2018-07-23 LAB — COMPREHENSIVE METABOLIC PANEL
ALK PHOS: 65 U/L (ref 38–126)
ALT: 40 U/L (ref 0–44)
AST: 30 U/L (ref 15–41)
Albumin: 3.6 g/dL (ref 3.5–5.0)
Anion gap: 8 (ref 5–15)
BUN: 18 mg/dL (ref 6–20)
CALCIUM: 8.8 mg/dL — AB (ref 8.9–10.3)
CO2: 25 mmol/L (ref 22–32)
Chloride: 108 mmol/L (ref 98–111)
Creatinine, Ser: 0.75 mg/dL (ref 0.44–1.00)
GFR calc Af Amer: >60 mL/min (ref >60)
GFR calc non Af Amer: >60 mL/min (ref >60)
Glucose, Bld: 107 mg/dL — ABNORMAL HIGH (ref 70–99)
Potassium: 3.6 mmol/L (ref 3.5–5.1)
SODIUM: 141 mmol/L (ref 135–145)
TOTAL PROTEIN: 6.2 g/dL — AB (ref 6.5–8.1)
Total Bilirubin: 0.5 mg/dL (ref 0.3–1.2)

## 2018-07-23 LAB — PROTIME-INR
INR: 1
Prothrombin Time: 13.1 seconds (ref 11.4–15.2)

## 2018-07-23 MED ORDER — HYDROCODONE-ACETAMINOPHEN 5-325 MG PO TABS
1.0000 | ORAL_TABLET | ORAL | Status: DC | PRN
  Filled 2018-07-23: qty 2

## 2018-07-23 MED ORDER — FENTANYL CITRATE (PF) 100 MCG/2ML IJ SOLN
INTRAMUSCULAR | Status: AC
  Filled 2018-07-23: qty 2

## 2018-07-23 MED ORDER — MIDAZOLAM HCL 2 MG/2ML IJ SOLN
INTRAMUSCULAR | Status: AC
  Filled 2018-07-23: qty 2

## 2018-07-23 MED ORDER — FENTANYL CITRATE (PF) 100 MCG/2ML IJ SOLN
INTRAMUSCULAR | Status: AC | PRN
  Administered 2018-07-23 (×3): 50 ug via INTRAVENOUS

## 2018-07-23 MED ORDER — MIDAZOLAM HCL 2 MG/2ML IJ SOLN
INTRAMUSCULAR | Status: AC | PRN
  Administered 2018-07-23 (×2): 1 mg via INTRAVENOUS

## 2018-07-23 MED ORDER — LIDOCAINE HCL 1 % IJ SOLN
INTRAMUSCULAR | Status: AC
  Filled 2018-07-23: qty 20

## 2018-07-23 MED ORDER — SODIUM CHLORIDE 0.9 % IV SOLN
INTRAVENOUS | Status: DC
  Administered 2018-07-23: 07:00:00 via INTRAVENOUS

## 2018-07-23 NOTE — Sedation Documentation (Signed)
Patient is resting comfortably. 

## 2018-07-23 NOTE — Procedures (Signed)
  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  Abdominal pain.  Procedure: CT guided core biopsy of mesenteric edema/stranding  Findings: Central mesenteric stranding, decreased since 06/24/18.  Coaxial needle placed along right side of mesenteric abnormality.  6 cores obtained.  No significant bleeding.  Complications: No immediate     EBL: Minimal skin bleeding  Plan: Bedrest 2 hours and discharge to home.

## 2018-07-23 NOTE — H&P (Signed)
Chief Complaint: Mesenteric mass  Referring Physician(s): Brunetta Genera  Supervising Physician: Markus Daft  Patient Status: [facility] - Out-pt  History of Present Illness: Carol Lawson is a 57 y.o. female who was evaluated for flank pain and hematuria.  She had a CT Renal Stone Study completed on 06/24/18 with results revealing Masslike haziness/opacity in the right central mesentery, progressed in size and density since prior study.   This could reflect mesenteric adenitis, however the masslike appearance is concerning for possible mesenteric lymphoma or other neoplastic process.  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 Possibilities include mesenteric panniculitis, idiopathic mesenteric stranding, or low-grade mesenteric lymphoma.   We are asked to perform a biopsy of the mass.  She is NPO. No blood thinners. She feels well today, no fever/chills, no N/V. She does have some abdominal discomfort she rates 2/10. Otherwise ROS negative.   Past Medical History:  Diagnosis Date  . GERD (gastroesophageal reflux disease)   . Hypertension    not on medication    Past Surgical History:  Procedure Laterality Date  . ACL repair of knee  2006  . CHOLECYSTECTOMY  2006  . intestinal sx     age 63  . meniscus repair of knee  2007    Allergies: Patient has no known allergies.  Medications: Prior to Admission medications   Medication Sig Start Date End Date Taking? Authorizing Provider  HYDROcodone-acetaminophen (NORCO/VICODIN) 5-325 MG tablet Take 1 tablet by mouth every 6 (six) hours as needed for moderate pain.   Yes [provider]  ibuprofen (ADVIL,MOTRIN) 200 MG tablet Take 200 mg by mouth every 6 (six) hours as needed.   Yes [provider]  ondansetron (ZOFRAN) 4 MG tablet Take 1 tablet (4 mg total) by  mouth every 8 (eight) hours as needed for nausea or vomiting. 07/05/18  Yes Brunetta Genera, MD  OVER THE COUNTER MEDICATION Place 3 drops under the tongue at bedtime. CBD Oil   Yes [provider]  promethazine (PHENERGAN) 25 MG tablet Take 25 mg by mouth every 6 (six) hours as needed for nausea or vomiting.  06/29/18  Yes [provider]     Family History  Problem Relation Age of Onset  . Breast cancer Mother   . Heart attack Father   . Heart disease Father     Social History   Socioeconomic History  . Marital status: Married    Spouse name: Not on file  . Number of children: Not on file  . Years of education: Not on file  . Highest education level: Not on file  Occupational History  . Not on file  Social Needs  . Financial resource strain: Not on file  . Food insecurity:    Worry: Not on file    Inability: Not on file  . Transportation needs:    Medical: Not on file    Non-medical: Not on file  Tobacco Use  . Smoking status: Never Smoker  . Smokeless tobacco: Never Used  Substance and Sexual Activity  . Alcohol use: Yes    Comment: occasionally wine  . Drug use: No  . Sexual activity: Not on file  Lifestyle  . Physical activity:    Days per week: Not on file    Minutes per session: Not on file  . Stress: Not on file  Relationships  . Social connections:    Talks on phone: Not on file    Gets together: Not on file    Attends religious service: Not on file    Active member of club or organization: Not on file    Attends meetings of clubs or organizations: Not on file    Relationship status: Not on file  Other Topics Concern  . Not on file  Social History Narrative  . Not on file     Review of Systems: A 12 point ROS discussed and pertinent positives are indicated in the HPI above.  All other systems are negative.  Review of Systems  Vital Signs: BP (!) 136/95   Pulse 87   Temp 97.7 F (36.5 C)   Resp 20   Ht 5\' 4"  (1.626 m)    Wt 74.8 kg   SpO2 99%   BMI 28.32 kg/m   Physical Exam  Constitutional: She is oriented to person, place, and time. She appears well-developed.  HENT:  Head: Normocephalic and atraumatic.  Eyes: EOM are normal.  Neck: Normal range of motion.  Cardiovascular: Normal rate, regular rhythm and normal heart sounds.  Pulmonary/Chest: Effort normal and breath sounds normal. No respiratory distress.  Abdominal: Soft. She exhibits no distension. There is no tenderness.  Musculoskeletal: Normal range of motion.  Neurological: She is alert and oriented to person, place, and time.  Skin: Skin is warm and dry.  Psychiatric: She has a normal mood and affect. Her behavior is normal. Judgment and thought content normal.  Vitals reviewed.   Imaging: Nm Pet Image Initial (pi) Skull Base To Thigh  Result Date: 07/16/2018 CLINICAL DATA:  Initial treatment strategy for mesenteric mass. EXAM: NUCLEAR MEDICINE PET SKULL BASE TO THIGH TECHNIQUE: 8.4 mCi F-18 FDG was injected intravenously. Full-ring PET imaging was performed from the skull base to thigh after the radiotracer. CT data was obtained and used for attenuation correction and anatomic localization. Fasting blood glucose: 98 mg/dl COMPARISON:  Multiple exams, including MRI abdomen from 06/25/2018 and CT abdomen from 06/24/2018 FINDINGS: Mediastinal blood pool activity: SUV max 2.1 NECK: Palatine tonsillar activity 6.1 on the right and 6.6 on the left. A left level IIa lymph node on image 27/4 measures 7 mm in diameter and has a maximum SUV of 4.5. No pathologically enlarged adenopathy in the neck and no other accentuated nodal activity in the neck. Incidental CT findings: none CHEST: No significant abnormal hypermetabolic activity in this region. Incidental CT findings: none ABDOMEN/PELVIS: The abnormal stranding along the mesentery has faintly accentuated metabolic activity, maximum SUV 1.9. No splenomegaly noted. Incidental CT findings: Cholecystectomy.  Suture material from prior laparotomy. An IUD appears satisfactorily positioned in the uterus. SKELETON: No significant abnormal hypermetabolic activity in this region. Incidental CT findings: Small sclerotic lesion posteriorly in the right femoral neck is probably a bone island and has no accentuated metabolic activity. IMPRESSION: 1. 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 Possibilities include mesenteric panniculitis, idiopathic mesenteric stranding, or low-grade mesenteric lymphoma. The liver appears normal and accordingly cirrhosis is not implicated as a possible cause. Given the pattern surrounding the mesenteric vessels, liposarcoma although not impossible would seem to be very unlikely. 2. There is a small left level IIa lymph node in the neck which has low-grade activity with maximum SUV 4.5, probably reactive/incidental. Electronically Signed   By: Cindra Eves.D.  On: 07/16/2018 15:40   Ct Renal Stone Study  Result Date: 06/24/2018 CLINICAL DATA:  Hematuria EXAM: CT ABDOMEN AND PELVIS WITHOUT CONTRAST TECHNIQUE: Multidetector CT imaging of the abdomen and pelvis was performed following the standard protocol without IV contrast. COMPARISON:  10/08/2017 FINDINGS: Lower chest: Lung bases are clear. No effusions. Heart is normal size. Hepatobiliary: No focal liver abnormality is seen. Status post cholecystectomy. No biliary dilatation. Pancreas: No focal abnormality or ductal dilatation. Spleen: No focal abnormality.  Normal size. Adrenals/Urinary Tract: No adrenal abnormality. No focal renal abnormality. No stones or hydronephrosis. Urinary bladder is unremarkable. Stomach/Bowel: Normal appendix. Stomach, large and small bowel grossly unremarkable. Vascular/Lymphatic: No aneurysm. Ill-defined soft tissue mass in the right mesentery measures up to 6 cm. Reproductive: IUD  noted in the uterus. Uterus and adnexa unremarkable. No mass. Other: No free fluid or free air. Musculoskeletal: No acute bony abnormality. IMPRESSION: Masslike haziness/opacity in the right central mesentery, progressed in size and density since prior study. 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 Prior cholecystectomy. Normal appendix. Electronically Signed   By: Rolm Baptise M.D.   On: 06/24/2018 07:14   Mr [employer] Spine  Result Date: 07/01/2018 This examination belongs to an outside facility and is stored here for comparison purposes only.  Contact the originating outside institution for any associated report or interpretation.   Labs:  CBC: Recent Labs    10/07/17 2327 06/24/18 0643 07/01/18 0938 07/23/18 0645  WBC 14.9* 8.2 4.3 4.9  HGB 15.7* 14.8 13.6 12.9  HCT 45.8 42.5 39.8 40.0  PLT 213 164 158 170    COAGS: Recent Labs    07/23/18 0645  INR 1.00    BMP: Recent Labs    10/07/17 2327 06/24/18 0643 07/01/18 0938 07/23/18 0645  NA 136 137 141 141  K 3.7 3.7 3.9 3.6  CL 103 102 105 108  CO2 24 24 30 25   GLUCOSE 113* 103* 95 107*  BUN 13 18 14 18   CALCIUM 9.5 8.5* 8.8* 8.8*  CREATININE 0.68 0.80 0.78 0.75  GFRNONAA >60 >60 >60 >60  GFRAA >60 >60 >60 >60    LIVER FUNCTION TESTS: Recent Labs    10/07/17 2327 06/24/18 0643 07/01/18 0938 07/23/18 0645  BILITOT 0.6 0.9 0.4 0.5  AST 32 17 29 30   ALT 48 22 45* 40  ALKPHOS 89 69 77 65  PROT 8.1 7.0 7.0 6.2*  ALBUMIN 4.6 4.0 3.8 3.6    TUMOR MARKERS: Recent Labs    07/01/18 0938  CHROMGRNA 2    Assessment and Plan:  Mesenteric mass  Will proceed with image guided biopsy today by Dr. Anselm Pancoast.  Risks and benefits discussed with the patient including, but not limited to bleeding, infection, damage to adjacent structures or low yield requiring additional tests.  All of the patient's  questions were answered, patient is agreeable to proceed. Consent signed and in chart.  Thank you for this interesting consult.  I greatly enjoyed meeting JEWELIANA DUDGEON and look forward to participating in their care.  A copy of this report was sent to the requesting provider on this date.  Electronically Signed: Murrell Redden, PA-C   07/23/2018, 7:41 AM      I spent a total of  30 Minutes  in face to face in clinical consultation, greater than 50% of which was counseling/coordinating care for mesenteric mass biopsy.

## 2018-07-23 NOTE — Sedation Documentation (Signed)
Patient denies pain and is resting comfortably.  

## 2018-07-23 NOTE — Sedation Documentation (Signed)
Painful to her, will give more narcotic

## 2018-07-23 NOTE — Discharge Instructions (Signed)
Needle Biopsy, Care After °These instructions give you information about caring for yourself after your procedure. Your doctor may also give you more specific instructions. Call your doctor if you have any problems or questions after your procedure. °Follow these instructions at home: °· Rest as told by your doctor. °· Take medicines only as told by your doctor. °· There are many different ways to close and cover the biopsy site, including stitches (sutures), skin glue, and adhesive strips. Follow instructions from your doctor about: °? How to take care of your biopsy site. °? When and how you should change your bandage (dressing). °? When you should remove your dressing. °? Removing whatever was used to close your biopsy site. °· Check your biopsy site every day for signs of infection. Watch for: °? Redness, swelling, or pain. °? Fluid, blood, or pus. °Contact a doctor if: °· You have a fever. °· You have redness, swelling, or pain at the biopsy site, and it lasts longer than a few days. °· You have fluid, blood, or pus coming from the biopsy site. °· You feel sick to your stomach (nauseous). °· You throw up (vomit). °Get help right away if: °· You are short of breath. °· You have trouble breathing. °· Your chest hurts. °· You feel dizzy or you pass out (faint). °· You have bleeding that does not stop with pressure or a bandage. °· You cough up blood. °· Your belly (abdomen) hurts. °This information is not intended to replace advice given to you by your health care provider. Make sure you discuss any questions you have with your health care provider. °Document Released: 09/11/2008 Document Revised: 03/06/2016 Document Reviewed: 09/25/2014 °Elsevier Interactive Patient Education © 2018 [employer] Inc[employer] ° °

## 2018-07-26 ENCOUNTER — Encounter: Payer: Self-pay | Admitting: Hematology

## 2018-07-27 ENCOUNTER — Encounter: Payer: Self-pay | Admitting: Hematology

## 2018-07-28 ENCOUNTER — Encounter: Payer: Self-pay | Admitting: Hematology

## 2018-07-28 ENCOUNTER — Telehealth: Payer: Self-pay

## 2018-07-28 NOTE — Telephone Encounter (Signed)
Pt request for update on biopsy to be released to MyChart. Returned msg stating that MD reviewing. Results to be sent upon review.

## 2018-07-29 ENCOUNTER — Encounter: Payer: Self-pay | Admitting: Hematology

## 2018-07-30 ENCOUNTER — Encounter: Payer: Self-pay | Admitting: Hematology

## 2018-08-02 ENCOUNTER — Telehealth: Payer: Self-pay | Admitting: Hematology

## 2018-08-02 NOTE — Telephone Encounter (Signed)
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  No evidence of malignancy. She has since been seen by Dr. Barry Dienes and has been referred to [facility] for a surgical consultation for additional tissue sampling. Previous tumor markers were unrevealing. We will see her after her [facility] evaluation and possible surgical biopsy as per her scheduled appointment on 09/14/2018. All her questions were answered in details.  Sullivan Lone MD [place] AAHIVMS [facility] Hematology/Oncology Physician [facility]  (Office):        [phone] (Work cell): [phone] (Fax):            [phone]

## 2018-09-01 ENCOUNTER — Telehealth: Payer: Self-pay | Admitting: Hematology

## 2018-09-01 NOTE — Telephone Encounter (Signed)
Patient called to cancel, will be going to [facility]

## 2018-09-14 ENCOUNTER — Ambulatory Visit: Payer: No Typology Code available for payment source | Admitting: Hematology

## 2018-09-14 ENCOUNTER — Other Ambulatory Visit: Payer: No Typology Code available for payment source

## 2019-10-16 ENCOUNTER — Emergency Department (HOSPITAL_BASED_OUTPATIENT_CLINIC_OR_DEPARTMENT_OTHER): Payer: No Typology Code available for payment source

## 2019-10-16 ENCOUNTER — Other Ambulatory Visit: Payer: Self-pay

## 2019-10-16 ENCOUNTER — Inpatient Hospital Stay (HOSPITAL_BASED_OUTPATIENT_CLINIC_OR_DEPARTMENT_OTHER)
Admission: EM | Admit: 2019-10-16 | Discharge: 2019-10-22 | DRG: 177 | Disposition: A | Discharge status: Home / Self Care | Payer: No Typology Code available for payment source | Attending: Student | Admitting: Student

## 2019-10-16 ENCOUNTER — Encounter (HOSPITAL_BASED_OUTPATIENT_CLINIC_OR_DEPARTMENT_OTHER): Payer: Self-pay | Admitting: Emergency Medicine

## 2019-10-16 DIAGNOSIS — Z23 Encounter for immunization: Secondary | ICD-10-CM | POA: Diagnosis not present

## 2019-10-16 DIAGNOSIS — J189 Pneumonia, unspecified organism: Secondary | ICD-10-CM | POA: Diagnosis present

## 2019-10-16 DIAGNOSIS — I10 Essential (primary) hypertension: Secondary | ICD-10-CM | POA: Diagnosis present

## 2019-10-16 DIAGNOSIS — R519 Headache, unspecified: Secondary | ICD-10-CM | POA: Diagnosis present

## 2019-10-16 DIAGNOSIS — J1282 Pneumonia due to coronavirus disease 2019: Secondary | ICD-10-CM | POA: Diagnosis present

## 2019-10-16 DIAGNOSIS — R0902 Hypoxemia: Secondary | ICD-10-CM | POA: Diagnosis present

## 2019-10-16 DIAGNOSIS — E876 Hypokalemia: Secondary | ICD-10-CM | POA: Diagnosis present

## 2019-10-16 DIAGNOSIS — H5711 Ocular pain, right eye: Secondary | ICD-10-CM | POA: Diagnosis present

## 2019-10-16 DIAGNOSIS — Z8249 Family history of ischemic heart disease and other diseases of the circulatory system: Secondary | ICD-10-CM | POA: Diagnosis not present

## 2019-10-16 DIAGNOSIS — U071 COVID-19: Secondary | ICD-10-CM | POA: Diagnosis not present

## 2019-10-16 DIAGNOSIS — R7401 Elevation of levels of liver transaminase levels: Secondary | ICD-10-CM | POA: Diagnosis present

## 2019-10-16 DIAGNOSIS — K219 Gastro-esophageal reflux disease without esophagitis: Secondary | ICD-10-CM | POA: Diagnosis present

## 2019-10-16 DIAGNOSIS — Z79899 Other long term (current) drug therapy: Secondary | ICD-10-CM | POA: Diagnosis not present

## 2019-10-16 DIAGNOSIS — Z9049 Acquired absence of other specified parts of digestive tract: Secondary | ICD-10-CM | POA: Diagnosis not present

## 2019-10-16 DIAGNOSIS — H532 Diplopia: Secondary | ICD-10-CM

## 2019-10-16 LAB — CBC WITH DIFFERENTIAL/PLATELET
Abs Immature Granulocytes: 0.08 10*3/uL — ABNORMAL HIGH (ref 0.00–0.07)
Basophils Absolute: 0 10*3/uL (ref 0.0–0.1)
Basophils Relative: 1 %
Eosinophils Absolute: 0 10*3/uL (ref 0.0–0.5)
Eosinophils Relative: 0 %
HCT: 42.7 % (ref 36.0–46.0)
Hemoglobin: 14.5 g/dL (ref 12.0–15.0)
Immature Granulocytes: 1 %
Lymphocytes Relative: 23 %
Lymphs Abs: 1.3 10*3/uL (ref 0.7–4.0)
MCH: 30.5 pg (ref 26.0–34.0)
MCHC: 34 g/dL (ref 30.0–36.0)
MCV: 89.9 fL (ref 80.0–100.0)
Monocytes Absolute: 0.7 10*3/uL (ref 0.1–1.0)
Monocytes Relative: 12 %
Neutro Abs: 3.6 10*3/uL (ref 1.7–7.7)
Neutrophils Relative %: 63 %
Platelets: 243 10*3/uL (ref 150–400)
RBC: 4.75 MIL/uL (ref 3.87–5.11)
RDW: 12.4 % (ref 11.5–15.5)
WBC: 5.8 10*3/uL (ref 4.0–10.5)
nRBC: 0 % (ref 0.0–0.2)

## 2019-10-16 LAB — SARS CORONAVIRUS 2 AG (30 MIN TAT): SARS Coronavirus 2 Ag: POSITIVE — AB

## 2019-10-16 LAB — C-REACTIVE PROTEIN: CRP: 6.4 mg/dL — ABNORMAL HIGH (ref <1.0)

## 2019-10-16 LAB — FERRITIN: Ferritin: 679 ng/mL — ABNORMAL HIGH (ref 11–307)

## 2019-10-16 LAB — COMPREHENSIVE METABOLIC PANEL
ALT: 100 U/L — ABNORMAL HIGH (ref 0–44)
AST: 77 U/L — ABNORMAL HIGH (ref 15–41)
Albumin: 3.7 g/dL (ref 3.5–5.0)
Alkaline Phosphatase: 142 U/L — ABNORMAL HIGH (ref 38–126)
Anion gap: 12 (ref 5–15)
BUN: 13 mg/dL (ref 6–20)
CO2: 27 mmol/L (ref 22–32)
Calcium: 8.6 mg/dL — ABNORMAL LOW (ref 8.9–10.3)
Chloride: 101 mmol/L (ref 98–111)
Creatinine, Ser: 0.82 mg/dL (ref 0.44–1.00)
GFR calc Af Amer: >60 mL/min (ref >60)
GFR calc non Af Amer: >60 mL/min (ref >60)
Glucose, Bld: 109 mg/dL — ABNORMAL HIGH (ref 70–99)
Potassium: 3.3 mmol/L — ABNORMAL LOW (ref 3.5–5.1)
Sodium: 140 mmol/L (ref 135–145)
Total Bilirubin: 0.8 mg/dL (ref 0.3–1.2)
Total Protein: 7.7 g/dL (ref 6.5–8.1)

## 2019-10-16 LAB — FIBRINOGEN: Fibrinogen: 646 mg/dL — ABNORMAL HIGH (ref 210–475)

## 2019-10-16 LAB — PREGNANCY, URINE: Preg Test, Ur: NEGATIVE

## 2019-10-16 LAB — LACTIC ACID, PLASMA: Lactic Acid, Venous: 1.3 mmol/L (ref 0.5–1.9)

## 2019-10-16 LAB — PROCALCITONIN: Procalcitonin: <0.1 ng/mL

## 2019-10-16 LAB — TRIGLYCERIDES: Triglycerides: 222 mg/dL — ABNORMAL HIGH (ref <150)

## 2019-10-16 LAB — D-DIMER, QUANTITATIVE: D-Dimer, Quant: 0.85 ug/mL-FEU — ABNORMAL HIGH (ref 0.00–0.50)

## 2019-10-16 LAB — LACTATE DEHYDROGENASE: LDH: 342 U/L — ABNORMAL HIGH (ref 98–192)

## 2019-10-16 MED ORDER — KETOROLAC TROMETHAMINE 15 MG/ML IJ SOLN
15.0000 mg | Freq: Once | INTRAMUSCULAR | Status: AC
  Administered 2019-10-16: 15 mg via INTRAVENOUS
  Filled 2019-10-16: qty 1

## 2019-10-16 MED ORDER — DEXAMETHASONE SODIUM PHOSPHATE 10 MG/ML IJ SOLN
6.0000 mg | Freq: Once | INTRAMUSCULAR | Status: AC
  Administered 2019-10-16: 6 mg via INTRAVENOUS
  Filled 2019-10-16: qty 1

## 2019-10-16 NOTE — ED Triage Notes (Signed)
Dx with COVID 12/26. Reports ongoing SOB

## 2019-10-16 NOTE — ED Notes (Signed)
Pt given water with verbal approval from Dr. Langston Masker

## 2019-10-16 NOTE — ED Notes (Signed)
Pt reports she had positive rapid covid test on 12/26. States that she has had increased SOB with extertion and low O2 sats since early this am. Also c/o headache 5/10 at present

## 2019-10-16 NOTE — ED Provider Notes (Signed)
[facility] EMERGENCY DEPARTMENT Provider Note   CSN: [id number] Arrival date & time: 10/16/19  1832     History Chief Complaint  Patient presents with  . Shortness of Breath    COVID+    Carol Lawson is a 59 y.o. female  Symptoms developed on Dec 23rd Tested positive for covid on Dec 26th (care everywhere shows positive result) Here with increasing shortness of breath, lightheadedness. She checked her pulse ox at home with ambulation and it was 79% today No history of cardiopulmonary disease prior to this.  No chest pain, syncope No hemoptysis or asymmetric LE edema. Patient denies personal or family history of DVT or PE. No recent hormone use (including OCP); travel for >6 hours; prolonged immobilization for greater than 3 days; surgeries or trauma in the last 4 weeks; or malignancy with treatment within 6 months.  No other significant medical issues  Intermittent nausea and vomiting at home over the past week, but overall keeping down food and fluids   HPI     Past Medical History:  Diagnosis Date  . GERD (gastroesophageal reflux disease)   . Hypertension    not on medication    Patient Active Problem List   Diagnosis Date Noted  . Pneumonia due to COVID-19 virus 10/16/2019  . Hypersomnia 10/20/2013  . Headache 10/20/2013  . HTN (hypertension) 10/04/2013  . Chest pain 10/04/2013    Past Surgical History:  Procedure Laterality Date  . ACL repair of knee  2006  . CHOLECYSTECTOMY  2006  . intestinal sx     age 60  . meniscus repair of knee  2007     OB History   No obstetric history on file.     Family History  Problem Relation Age of Onset  . Breast cancer Mother   . Heart attack Father   . Heart disease Father     Social History   Tobacco Use  . Smoking status: Never Smoker  . Smokeless tobacco: Never Used  Substance Use Topics  . Alcohol use: Yes    Comment: occasionally wine  . Drug use: No    Home Medications Prior to  Admission medications   Medication Sig Start Date End Date Taking? Authorizing Provider  HYDROcodone-acetaminophen (NORCO/VICODIN) 5-325 MG tablet Take 1 tablet by mouth every 6 (six) hours as needed for moderate pain.    [provider]  ibuprofen (ADVIL,MOTRIN) 200 MG tablet Take 200 mg by mouth every 6 (six) hours as needed.    [provider]  ondansetron (ZOFRAN) 4 MG tablet Take 1 tablet (4 mg total) by mouth every 8 (eight) hours as needed for nausea or vomiting. 07/05/18   Brunetta Genera, MD  OVER THE COUNTER MEDICATION Place 3 drops under the tongue at bedtime. CBD Oil    [provider]  promethazine (PHENERGAN) 25 MG tablet Take 25 mg by mouth every 6 (six) hours as needed for nausea or vomiting.  06/29/18   [provider]    Allergies    Patient has no known allergies.  Review of Systems   Review of Systems  Constitutional: Positive for appetite change, chills, fatigue and fever.  Eyes: Negative for visual disturbance.  Respiratory: Positive for cough and shortness of breath.   Cardiovascular: Negative for chest pain and palpitations.  Gastrointestinal: Positive for nausea and vomiting. Negative for abdominal pain.  Musculoskeletal: Positive for myalgias. Negative for back pain.  Skin: Negative for pallor and rash.  Neurological: Positive  for light-headedness. Negative for syncope and headaches.  All other systems reviewed and are negative.   Physical Exam Updated Vital Signs BP 135/83 (BP Location: Left Arm)   Pulse 87   Temp 98.3 F (36.8 C) (Oral)   Resp 14   Ht 5\' 3"  (1.6 m)   Wt 79.4 kg   SpO2 97%   BMI 31.00 kg/m   Physical Exam Vitals and nursing note reviewed.  Constitutional:      General: She is not in acute distress.    Appearance: She is well-developed.  HENT:     Head: Normocephalic and atraumatic.  Eyes:     Conjunctiva/sclera: Conjunctivae normal.  Cardiovascular:     Rate and Rhythm: Normal rate and  regular rhythm.  Pulmonary:     Effort: Pulmonary effort is normal. No respiratory distress.     Breath sounds: Examination of the right-middle field reveals rhonchi. Examination of the left-middle field reveals rhonchi. Examination of the right-lower field reveals rhonchi. Examination of the left-lower field reveals rhonchi. Rhonchi present.     Comments: 86% on room air, improved to 97% on 3L [place]  Abdominal:     Palpations: Abdomen is soft.     Tenderness: There is no abdominal tenderness.  Musculoskeletal:     Cervical back: Neck supple.  Skin:    General: Skin is warm and dry.  Neurological:     General: No focal deficit present.     Mental Status: She is alert and oriented to person, place, and time.     ED Results / Procedures / Treatments   Labs (all labs ordered are listed, but only abnormal results are displayed) Labs Reviewed  SARS CORONAVIRUS 2 AG (30 MIN TAT) - Abnormal; Notable for the following components:      Result Value   SARS Coronavirus 2 Ag POSITIVE (*)    All other components within normal limits  CBC WITH DIFFERENTIAL/PLATELET - Abnormal; Notable for the following components:   Abs Immature Granulocytes 0.08 (*)    All other components within normal limits  COMPREHENSIVE METABOLIC PANEL - Abnormal; Notable for the following components:   Potassium 3.3 (*)    Glucose, Bld 109 (*)    Calcium 8.6 (*)    AST 77 (*)    ALT 100 (*)    Alkaline Phosphatase 142 (*)    All other components within normal limits  D-DIMER, QUANTITATIVE (NOT AT [facility]) - Abnormal; Notable for the following components:   D-Dimer, Quant 0.85 (*)    All other components within normal limits  LACTATE DEHYDROGENASE - Abnormal; Notable for the following components:   LDH 342 (*)    All other components within normal limits  FERRITIN - Abnormal; Notable for the following components:   Ferritin 679 (*)    All other components within normal limits  TRIGLYCERIDES - Abnormal; Notable for  the following components:   Triglycerides 222 (*)    All other components within normal limits  FIBRINOGEN - Abnormal; Notable for the following components:   Fibrinogen 646 (*)    All other components within normal limits  C-REACTIVE PROTEIN - Abnormal; Notable for the following components:   CRP 6.4 (*)    All other components within normal limits  CULTURE, BLOOD (ROUTINE X 2)  CULTURE, BLOOD (ROUTINE X 2)  LACTIC ACID, PLASMA  PROCALCITONIN  PREGNANCY, URINE    EKG EKG Interpretation  Date/Time:  Sunday October 16 2019 20:36:15 EST Ventricular Rate:  95 PR Interval:  QRS Duration: 100 QT Interval:  365 QTC Calculation: 452 R Axis:   40 Text Interpretation: Sinus rhythm Baseline wander in lead(s) V1 V2 No STEMI Confirmed by Octaviano Glow [phone]) on 10/16/2019 8:49:40 PM   Radiology DG Chest Portable 1 View  Result Date: 10/16/2019 CLINICAL DATA:  COVID-19. Increasing shortness of breath. Weakness. Lethargy. EXAM: PORTABLE CHEST 1 VIEW COMPARISON:  11/14/2005 FINDINGS: Midline trachea. Normal heart size. No pleural effusion or pneumothorax. Mild right hemidiaphragm elevation. Mild interstitial opacities are suspected in the periphery of both lung bases. There may also be mild left midlung interstitial opacity. IMPRESSION: Suspicion of subtle lower lung predominant interstitial opacities, as can be seen with mild COVID-19 pneumonia. Electronically Signed   By: Abigail Miyamoto M.D.   On: 10/16/2019 19:38    Procedures .Critical Care Performed by: Wyvonnia Dusky, MD Authorized by: Wyvonnia Dusky, MD   Critical care provider statement:    Critical care time (minutes):  30   Critical care was necessary to treat or prevent imminent or life-threatening deterioration of the following conditions:  Respiratory failure   Critical care was time spent personally by me on the following activities:  Discussions with consultants, evaluation of patient's response to treatment,  examination of patient, ordering and performing treatments and interventions, ordering and review of laboratory studies, ordering and review of radiographic studies, pulse oximetry, re-evaluation of patient's condition, obtaining history from patient or surrogate and review of old charts Comments:     Covid pneumonai requiring supplemental oxygen   (including critical care time)  Medications Ordered in ED Medications  remdesivir 200 mg in sodium chloride 0.9% 250 mL IVPB (has no administration in time range)    Followed by  remdesivir 100 mg in sodium chloride 0.9 % 100 mL IVPB (has no administration in time range)  dexamethasone (DECADRON) injection 6 mg (6 mg Intravenous Given 10/16/19 2304)  ketorolac (TORADOL) 15 MG/ML injection 15 mg (15 mg Intravenous Given 10/16/19 2308)    ED Course  I have reviewed the triage vital signs and the nursing notes.  Pertinent labs & imaging results that were available during my care of the patient were reviewed by me and considered in my medical decision making (see chart for details).  59 yo female presenting to ED with COVID pneumonia and hypoxia.  Now approx on day 10 from symptom onset  Hypoxic on room air, improved and stable on 3L [place]  Low suspicion for bacterial sepsis or bacterial PNA given more likely viral clinical syndrome.  Lactate wnl.  No leukocytosis.  No sepsis w/u.  Will discuss admission with hospitalist    Carol Lawson was evaluated in Emergency Department on 10/17/2019 for the symptoms described in the history of present illness. She was evaluated in the context of the global COVID-19 pandemic, which necessitated consideration that the patient might be at risk for infection with the SARS-CoV-2 virus that causes COVID-19. Institutional protocols and algorithms that pertain to the evaluation of patients at risk for COVID-19 are in a state of rapid change based on information released by regulatory bodies including the CDC and federal and  state organizations. These policies and algorithms were followed during the patient's care in the ED.  This note was dictated using dragon dictation software.  Please be aware that there may be minor translation errors as a result of this oral dictation    Clinical Course as of Oct 17 127  Sun Oct 16, 2019  2336 Sign out given to  Dr Flossie Buffy   [MT]    Clinical Course User Index [MT] Langston Masker, Carola Rhine, MD    Final Clinical Impression(s) / ED Diagnoses Final diagnoses:  [id number]  Hypoxia    Rx / DC Orders ED Discharge Orders    None       Josilyn Shippee, Carola Rhine, MD 10/17/19 [phone]

## 2019-10-17 DIAGNOSIS — J189 Pneumonia, unspecified organism: Secondary | ICD-10-CM | POA: Diagnosis present

## 2019-10-17 MED ORDER — ACETAMINOPHEN 325 MG PO TABS
650.0000 mg | ORAL_TABLET | Freq: Once | ORAL | Status: AC
  Administered 2019-10-17: 650 mg via ORAL
  Filled 2019-10-17: qty 2

## 2019-10-17 MED ORDER — ACETAMINOPHEN 325 MG PO TABS
650.0000 mg | ORAL_TABLET | ORAL | Status: DC | PRN
  Administered 2019-10-17 – 2019-10-18 (×4): 650 mg via ORAL
  Filled 2019-10-17 (×4): qty 2

## 2019-10-17 MED ORDER — KETOROLAC TROMETHAMINE 30 MG/ML IJ SOLN
15.0000 mg | Freq: Once | INTRAMUSCULAR | Status: AC
  Administered 2019-10-17: 15 mg via INTRAVENOUS
  Filled 2019-10-17: qty 1

## 2019-10-17 MED ORDER — SODIUM CHLORIDE 0.9 % IV SOLN
200.0000 mg | Freq: Once | INTRAVENOUS | Status: AC
  Administered 2019-10-17: 200 mg via INTRAVENOUS
  Filled 2019-10-17: qty 40
  Filled 2019-10-17: qty 200

## 2019-10-17 MED ORDER — SODIUM CHLORIDE 0.9 % IV SOLN
100.0000 mg | Freq: Every day | INTRAVENOUS | Status: AC
  Administered 2019-10-18 – 2019-10-21 (×4): 100 mg via INTRAVENOUS
  Filled 2019-10-17 (×5): qty 20

## 2019-10-17 NOTE — ED Provider Notes (Signed)
  Physical Exam  BP (!) 145/76   Pulse 73   Temp 98.2 F (36.8 C)   Resp 17   Ht 5\' 3"  (1.6 m)   Wt 79.4 kg   SpO2 100%   BMI 31.00 kg/m   Physical Exam  ED Course/Procedures   Clinical Course as of Oct 16 2356  Nancy Fetter Oct 16, 2019  2336 Sign out given to Dr Flossie Buffy   [MT]    Clinical Course User Index [MT] Wyvonnia Dusky, MD    Procedures  MDM  Patient with 1 of 2+ blood cultures for gram-positive cocci.  Discussed with Dr. Flossie Buffy.  Will start vancomycin.  It appears the patient has a bed.        Davonna Belling, MD 10/17/19 [phone]

## 2019-10-17 NOTE — ED Provider Notes (Signed)
Pt requested medication for headache. Tylenol ordered.   Dorie Rank, MD 10/17/19 [phone]

## 2019-10-18 DIAGNOSIS — U071 COVID-19: Secondary | ICD-10-CM | POA: Diagnosis present

## 2019-10-18 LAB — BRAIN NATRIURETIC PEPTIDE: B Natriuretic Peptide: 39.1 pg/mL (ref 0.0–100.0)

## 2019-10-18 LAB — CBC
HCT: 41.9 % (ref 36.0–46.0)
Hemoglobin: 14.1 g/dL (ref 12.0–15.0)
MCH: 30.5 pg (ref 26.0–34.0)
MCHC: 33.7 g/dL (ref 30.0–36.0)
MCV: 90.5 fL (ref 80.0–100.0)
Platelets: 292 10*3/uL (ref 150–400)
RBC: 4.63 MIL/uL (ref 3.87–5.11)
RDW: 12.5 % (ref 11.5–15.5)
WBC: 9.4 10*3/uL (ref 4.0–10.5)
nRBC: 0 % (ref 0.0–0.2)

## 2019-10-18 LAB — CREATININE, SERUM
Creatinine, Ser: 0.59 mg/dL (ref 0.44–1.00)
GFR calc Af Amer: >60 mL/min (ref >60)
GFR calc non Af Amer: >60 mL/min (ref >60)

## 2019-10-18 LAB — MAGNESIUM: Magnesium: 2.2 mg/dL (ref 1.7–2.4)

## 2019-10-18 LAB — C-REACTIVE PROTEIN: CRP: 2.2 mg/dL — ABNORMAL HIGH (ref <1.0)

## 2019-10-18 LAB — ABO/RH: ABO/RH(D): O POS

## 2019-10-18 LAB — D-DIMER, QUANTITATIVE: D-Dimer, Quant: 0.94 ug/mL-FEU — ABNORMAL HIGH (ref 0.00–0.50)

## 2019-10-18 LAB — PROCALCITONIN: Procalcitonin: <0.1 ng/mL

## 2019-10-18 MED ORDER — ONDANSETRON HCL 4 MG/2ML IJ SOLN
4.0000 mg | Freq: Once | INTRAMUSCULAR | Status: AC
  Administered 2019-10-18: 06:00:00 4 mg via INTRAVENOUS
  Filled 2019-10-18: qty 2

## 2019-10-18 MED ORDER — ACETAMINOPHEN 325 MG PO TABS: 650.0000 mg | ORAL_TABLET | Freq: Four times a day (QID) | ORAL | Status: DC | PRN

## 2019-10-18 MED ORDER — POTASSIUM CHLORIDE CRYS ER 20 MEQ PO TBCR
40.0000 meq | EXTENDED_RELEASE_TABLET | Freq: Once | ORAL | Status: AC
  Administered 2019-10-18: 14:00:00 40 meq via ORAL

## 2019-10-18 MED ORDER — METHYLPREDNISOLONE SODIUM SUCC 40 MG IJ SOLR: 40.0000 mg | Freq: Every day | INTRAMUSCULAR | Status: DC

## 2019-10-18 MED ORDER — INFLUENZA VAC SPLIT QUAD 0.5 ML IM SUSY
0.5000 mL | PREFILLED_SYRINGE | INTRAMUSCULAR | Status: AC | PRN
  Administered 2019-10-22: 0.5 mL via INTRAMUSCULAR
  Filled 2019-10-18 (×2): qty 0.5

## 2019-10-18 MED ORDER — LACTATED RINGERS IV SOLN: INTRAVENOUS | Status: AC

## 2019-10-18 MED ORDER — VANCOMYCIN HCL IN DEXTROSE 1-5 GM/200ML-% IV SOLN
1000.0000 mg | Freq: Once | INTRAVENOUS | Status: AC
  Administered 2019-10-18: 1000 mg via INTRAVENOUS
  Filled 2019-10-18: qty 200

## 2019-10-18 MED ORDER — METHYLPREDNISOLONE SODIUM SUCC 125 MG IJ SOLR
60.0000 mg | Freq: Every day | INTRAMUSCULAR | Status: DC
  Administered 2019-10-18: 12:00:00 60 mg via INTRAVENOUS
  Filled 2019-10-18: qty 2

## 2019-10-18 MED ORDER — ENOXAPARIN SODIUM 40 MG/0.4ML ~~LOC~~ SOLN
40.0000 mg | SUBCUTANEOUS | Status: DC
  Administered 2019-10-18 – 2019-10-21 (×4): 40 mg via SUBCUTANEOUS
  Filled 2019-10-18 (×4): qty 0.4

## 2019-10-18 MED ORDER — ONDANSETRON HCL 4 MG PO TABS: 4.0000 mg | ORAL_TABLET | Freq: Four times a day (QID) | ORAL | Status: DC | PRN

## 2019-10-18 MED ORDER — PROMETHAZINE HCL 25 MG PO TABS: 25.0000 mg | ORAL_TABLET | Freq: Four times a day (QID) | ORAL | Status: DC | PRN

## 2019-10-18 MED ORDER — VANCOMYCIN HCL 750 MG/150ML IV SOLN
750.0000 mg | Freq: Two times a day (BID) | INTRAVENOUS | Status: DC
  Filled 2019-10-18 (×2): qty 150

## 2019-10-18 MED ORDER — ONDANSETRON HCL 4 MG/2ML IJ SOLN
4.0000 mg | Freq: Four times a day (QID) | INTRAMUSCULAR | Status: DC | PRN
  Administered 2019-10-18 – 2019-10-21 (×4): 4 mg via INTRAVENOUS
  Filled 2019-10-18 (×4): qty 2

## 2019-10-18 MED ORDER — HYDROCODONE-ACETAMINOPHEN 5-325 MG PO TABS
1.0000 | ORAL_TABLET | Freq: Four times a day (QID) | ORAL | Status: DC | PRN
  Administered 2019-10-19 – 2019-10-20 (×4): 1 via ORAL
  Filled 2019-10-18 (×4): qty 1

## 2019-10-18 MED ORDER — ACETAMINOPHEN 500 MG PO TABS
500.0000 mg | ORAL_TABLET | ORAL | Status: DC | PRN
  Administered 2019-10-18 – 2019-10-21 (×6): 500 mg via ORAL
  Filled 2019-10-18 (×6): qty 1

## 2019-10-18 MED ORDER — ALBUTEROL SULFATE HFA 108 (90 BASE) MCG/ACT IN AERS
2.0000 | INHALATION_SPRAY | Freq: Four times a day (QID) | RESPIRATORY_TRACT | Status: DC | PRN
  Filled 2019-10-18: qty 6.7

## 2019-10-18 MED ORDER — SENNOSIDES-DOCUSATE SODIUM 8.6-50 MG PO TABS: 1.0000 | ORAL_TABLET | Freq: Every evening | ORAL | Status: DC | PRN

## 2019-10-18 NOTE — Progress Notes (Signed)
Husband called, she is updated, all questions answered.

## 2019-10-18 NOTE — Evaluation (Signed)
Physical Therapy Evaluation Patient Details Name: Carol Lawson MRN: [id number] DOB: 10-15-1960 Today's Date: 10/18/2019   History of Present Illness  59 y.o. female, with H/O Benign mesenteric mass, GERD, HTN who was diagnosed with Covid 19 on the 26th of December outpt, presented to [facility] with mild cough, exertional SOB and fatigue on 10/16/19, her lab work was stable, she had exertional dyspnea and was sent to [facility] for Rx.  Clinical Impression   Pt admitted with above diagnosis. PTA was living home with family and was independent with all ADLs and IADLs. Pt currently with functional limitations due to the deficits listed below (see PT Problem List). This pm pt is doing well with mobility at mod I with bed mob and tranfers, she does c/o dizziness and fatigues quickly, was able to ambulate approx 125ft with min guard and hand held assist. This pm pt was on room air and managed to keep sats in 90s throughout. Pt will benefit from skilled PT to increase their independence and safety with mobility to allow discharge to the venue listed below.       Follow Up Recommendations No PT follow up    Equipment Recommendations  None recommended by PT    Recommendations for Other Services OT consult     Precautions / Restrictions Precautions Precautions: Other (comment) Precaution Comments: c/o dizziness reports has had vertigo in past Restrictions Weight Bearing Restrictions: No      Mobility  Bed Mobility Overal bed mobility: Modified Independent                Transfers Overall transfer level: Modified independent Equipment used: None                Ambulation/Gait Ambulation/Gait assistance: Min guard Gait Distance (Feet): 120 Feet Assistive device: 1 person hand held assist Gait Pattern/deviations: Step-through pattern     General Gait Details: started well but then c/o dizziness  Stairs            Wheelchair Mobility    Modified Rankin (Stroke Patients  Only)       Balance Overall balance assessment: Mild deficits observed, not formally tested                                           Pertinent Vitals/Pain Pain Assessment: (reports having slight headache)    Home Living Family/patient expects to be discharged to:: Private residence Living Arrangements: Spouse/significant other;Children Available Help at Discharge: Family Type of Home: House Home Access: Stairs to enter Entrance Stairs-Rails: (both but far apart) Technical brewer of Steps: 6 Home Layout: Two level Home Equipment: None      Prior Function Level of Independence: Independent               Hand Dominance   Dominant Hand: Right    Extremity/Trunk Assessment   Upper Extremity Assessment Upper Extremity Assessment: Overall WFL for tasks assessed    Lower Extremity Assessment Lower Extremity Assessment: Overall WFL for tasks assessed    Cervical / Trunk Assessment Cervical / Trunk Assessment: Normal  Communication   Communication: No difficulties  Cognition Arousal/Alertness: Awake/alert Behavior During Therapy: WFL for tasks assessed/performed Overall Cognitive Status: Within Functional Limits for tasks assessed  General Comments      Exercises Other Exercises Other Exercises: incentive spirometer pulled 764ml Other Exercises: flutter valve use   Assessment/Plan    PT Assessment Patient needs continued PT services  PT Problem List Decreased mobility;Decreased activity tolerance       PT Treatment Interventions Gait training;Therapeutic activities;Neuromuscular re-education;Functional mobility training;Balance training;Patient/family education;Therapeutic exercise;Stair training    PT Goals (Current goals can be found in the Care Plan section)  Acute Rehab PT Goals Patient Stated Goal: to go home PT Goal Formulation: With patient Time For Goal  Achievement: 11/01/19 Potential to Achieve Goals: Good    Frequency Min 3X/week   Barriers to discharge        Co-evaluation               AM-PAC PT "6 Clicks" Mobility  Outcome Measure Help needed turning from your back to your side while in a flat bed without using bedrails?: None Help needed moving from lying on your back to sitting on the side of a flat bed without using bedrails?: None Help needed moving to and from a bed to a chair (including a wheelchair)?: A Little Help needed standing up from a chair using your arms (e.g., wheelchair or bedside chair)?: A Little Help needed to walk in hospital room?: A Little Help needed climbing 3-5 steps with a railing? : A Little 6 Click Score: 20    End of Session   Activity Tolerance: Patient limited by lethargy;Other (comment) Patient left: in bed;with call bell/phone within reach Nurse Communication: Mobility status PT Visit Diagnosis: Other abnormalities of gait and mobility (R26.89)    Time: [id number] PT Time Calculation (min) (ACUTE ONLY): 19 min   Charges:   PT Evaluation $PT Eval Low Complexity: [place], PT   Delford Field 10/18/2019, 3:51 PM

## 2019-10-18 NOTE — H&P (Signed)
TRH H&P   Patient Demographics:    Carol Lawson, is a 59 y.o. female  MRN: [id number]   DOB - Jul 08, 1961  Admit Date - 10/16/2019  Outpatient Primary MD for the patient is Antony Contras, MD    Patient coming from: Home >> [facility]  Chief Complaint  Patient presents with  . Shortness of Breath    COVID+      HPI:    Carol Lawson  is a 59 y.o. female, with H/O Benign mesenteric mass, GERD, HTN who was diagnosed with Covid 19 on the 26th of December outpt, presented to [facility] with mild cough, exertional SOB and fatigue on 10/16/19, her lab work was stable, she had exertional dyspnea and was sent to [facility] for Rx.  Currently patient denies any fever, headache, no SOB at rest or cough, some exertional SOB with mild diarrhea and lots of fatigue. No other symptoms.    Review of systems:    A full 10 point Review of Systems was done, except as stated above, all other Review of Systems were negative.   With Past History of the following :    Past Medical History:  Diagnosis Date  . GERD (gastroesophageal reflux disease)   . Hypertension    not on medication      Past Surgical History:  Procedure Laterality Date  . ACL repair of knee  2006  . CHOLECYSTECTOMY  2006  . intestinal sx     age 89  . meniscus repair of knee  2007      Social History:     Social History   Tobacco Use  . Smoking status: Never Smoker  . Smokeless tobacco: Never Used  Substance Use Topics  . Alcohol use: Yes    Comment: occasionally wine         Family History :     Family History  Problem Relation Age of Onset  . Breast cancer Mother   . Heart attack Father   . Heart disease Father        Home Medications:   Prior to  Admission medications   Medication Sig Start Date End Date Taking? Authorizing Provider  HYDROcodone-acetaminophen (NORCO/VICODIN) 5-325 MG tablet Take 1 tablet by mouth every 6 (six) hours as needed for moderate pain.    [provider]  ibuprofen (ADVIL,MOTRIN)  200 MG tablet Take 200 mg by mouth every 6 (six) hours as needed.    [provider]  ondansetron (ZOFRAN) 4 MG tablet Take 1 tablet (4 mg total) by mouth every 8 (eight) hours as needed for nausea or vomiting. 07/05/18   Brunetta Genera, MD  OVER THE COUNTER MEDICATION Place 3 drops under the tongue at bedtime. CBD Oil    [provider]  promethazine (PHENERGAN) 25 MG tablet Take 25 mg by mouth every 6 (six) hours as needed for nausea or vomiting.  06/29/18   [provider]     Allergies:    No Known Allergies   Physical Exam:   Vitals  Blood pressure (!) 130/59, pulse 81, temperature 98 F (36.7 C), temperature source Oral, resp. rate 20, height 5\' 3"  (1.6 m), weight 77.6 kg, SpO2 95 %.   1. General middle aged white female, laying in bed in no distress,  2. Normal affect and insight, Not Suicidal or Homicidal, Awake Alert, Oriented X 3.  3. No F.N deficits, ALL C.Nerves Intact, Strength 5/5 all 4 extremities, Sensation intact all 4 extremities, Plantars down going.  4. Ears and Eyes appear Normal, Conjunctivae clear, PERRLA. Moist Oral Mucosa.  5. Supple Neck, No JVD, No cervical lymphadenopathy appriciated, No Carotid Bruits.  6. Symmetrical Chest wall movement, Good air movement bilaterally, CTAB.  7. RRR, No Gallops, Rubs or Murmurs, No Parasternal Heave.  8. Positive Bowel Sounds, Abdomen Soft, No tenderness, No organomegaly appriciated,No rebound -guarding or rigidity.  9.  No Cyanosis, Normal Skin Turgor, No Skin Rash or Bruise.  10. Good muscle tone,  joints appear normal , no effusions, Normal ROM.  11. No Palpable Lymph Nodes in Neck or Axillae      Data  Review:    CBC Recent Labs  Lab 10/16/19 1921 10/18/19 1203  WBC 5.8 9.4  HGB 14.5 14.1  HCT 42.7 41.9  PLT 243 292  MCV 89.9 90.5  MCH 30.5 30.5  MCHC 34.0 33.7  RDW 12.4 12.5  LYMPHSABS 1.3  --   MONOABS 0.7  --   EOSABS 0.0  --   BASOSABS 0.0  --    ------------------------------------------------------------------------------------------------------------------  Chemistries  Recent Labs  Lab 10/16/19 1921  NA 140  K 3.3*  CL 101  CO2 27  GLUCOSE 109*  BUN 13  CREATININE 0.82  CALCIUM 8.6*  AST 77*  ALT 100*  ALKPHOS 142*  BILITOT 0.8   ------------------------------------------------------------------------------------------------------------------ estimated creatinine clearance is 75.6 mL/min (by C-G formula based on SCr of 0.82 mg/dL). ------------------------------------------------------------------------------------------------------------------ No results for input(s): TSH, T4TOTAL, T3FREE, THYROIDAB in the last 72 hours.  Invalid input(s): FREET3  Coagulation profile No results for input(s): INR, PROTIME in the last 168 hours. ------------------------------------------------------------------------------------------------------------------- Recent Labs    10/16/19 1921  DDIMER 0.85*   -------------------------------------------------------------------------------------------------------------------  Cardiac Enzymes No results for input(s): CKMB, TROPONINI, MYOGLOBIN in the last 168 hours.  Invalid input(s): CK ------------------------------------------------------------------------------------------------------------------ No results found for: BNP   ---------------------------------------------------------------------------------------------------------------  Urinalysis    Component Value Date/Time   COLORURINE YELLOW 06/24/2018 [place] 06/24/2018 0555   LABSPEC 1.025 06/24/2018 0555   PHURINE 6.0 06/24/2018 0555     GLUCOSEU NEGATIVE 06/24/2018 0555   HGBUR MODERATE (A) 06/24/2018 0555   BILIRUBINUR NEGATIVE 06/24/2018 0555   KETONESUR 40 (A) 06/24/2018 0555   PROTEINUR NEGATIVE 06/24/2018 0555   NITRITE NEGATIVE 06/24/2018 0555   LEUKOCYTESUR NEGATIVE 06/24/2018 0555   COVID-19 Labs  Recent Labs  10/16/19 1921  DDIMER 0.85*  FERRITIN 679*  LDH 342*  CRP 6.4*    No results found for: SARSCOV2NAA  ----------------------------------------------------------------------------------------------------------------   Imaging Results:    DG Chest Portable 1 View  Result Date: 10/16/2019 CLINICAL DATA:  COVID-19. Increasing shortness of breath. Weakness. Lethargy. EXAM: PORTABLE CHEST 1 VIEW COMPARISON:  11/14/2005 FINDINGS: Midline trachea. Normal heart size. No pleural effusion or pneumothorax. Mild right hemidiaphragm elevation. Mild interstitial opacities are suspected in the periphery of both lung bases. There may also be mild left midlung interstitial opacity. IMPRESSION: Suspicion of subtle lower lung predominant interstitial opacities, as can be seen with mild COVID-19 pneumonia. Electronically Signed   By: Abigail Miyamoto M.D.   On: 10/16/2019 19:38    My personal review of EKG: Rhythm NSR, Rate  95 /min,  no Acute ST changes   Assessment & Plan:      1. Mild Covid Pneumonia and Gastroenteritis - will be placed on IV Steroids, Remdesivir and IVF, monitor clinically and Inflamm markers. Encouraged to sit in chair in am, use IS and FV q hourly while awake.  COVID-19 Labs  Recent Labs    10/16/19 1921 10/18/19 1203  DDIMER 0.85* 0.94*  FERRITIN 679*  --   LDH 342*  --   CRP 6.4*  --     No results found for: SARSCOV2NAA   2. 1/2 Staph Lugd[place]ensis Blood culture - contaminant.  3. Hypokalemia - replace.    DVT Prophylaxis  Lovenox    AM Labs Ordered, also please review Full Orders  Family Communication: Admission, patients condition and plan of care including tests being  ordered have been discussed with the patient  who indicates understanding and agree with the plan and Code Status.  Code Status Full  Likely DC to  Home  Condition Fair  Consults called: None    Admission status: Inpt    Time spent in minutes : 35   Lala Lund M.D on 10/18/2019 at 1:37 PM  To page go to [email or web address] - password [email or web address]

## 2019-10-18 NOTE — Progress Notes (Signed)
Pharmacy Antibiotic Note  Carol Lawson is a 59 y.o. female admitted on 10/16/2019 with COVID.  Pharmacy has been consulted for Vancomycin dosing for possible bacteremia. 1/2 bottles with gram positive cocci.   Plan: Vancomycin 750 mg IV q12h >>Estimated AUC: 463 Trend WBC, temp, renal function  F/U infectious work-up Drug levels as indicated   Height: 5\' 3"  (160 cm) Weight: 175 lb (79.4 kg) IBW/kg (Calculated) : 52.4  Temp (24hrs), Avg:98.2 F (36.8 C), Min:98.2 F (36.8 C), Max:98.2 F (36.8 C)  Recent Labs  Lab 10/16/19 1921  WBC 5.8  CREATININE 0.82  LATICACIDVEN 1.3    Estimated Creatinine Clearance: 76.4 mL/min (by C-G formula based on SCr of 0.82 mg/dL).    No Known Allergies  Narda Bonds, PharmD, BCPS Clinical Pharmacist Phone: [phone]

## 2019-10-19 ENCOUNTER — Inpatient Hospital Stay (HOSPITAL_COMMUNITY): Payer: No Typology Code available for payment source

## 2019-10-19 LAB — CBC WITH DIFFERENTIAL/PLATELET
Abs Immature Granulocytes: 0.1 10*3/uL — ABNORMAL HIGH (ref 0.00–0.07)
Basophils Absolute: 0 10*3/uL (ref 0.0–0.1)
Basophils Relative: 0 %
Eosinophils Absolute: 0 10*3/uL (ref 0.0–0.5)
Eosinophils Relative: 0 %
HCT: 37.9 % (ref 36.0–46.0)
Hemoglobin: 12.7 g/dL (ref 12.0–15.0)
Immature Granulocytes: 1 %
Lymphocytes Relative: 14 %
Lymphs Abs: 1 10*3/uL (ref 0.7–4.0)
MCH: 30.4 pg (ref 26.0–34.0)
MCHC: 33.5 g/dL (ref 30.0–36.0)
MCV: 90.7 fL (ref 80.0–100.0)
Monocytes Absolute: 0.7 10*3/uL (ref 0.1–1.0)
Monocytes Relative: 9 %
Neutro Abs: 5.7 10*3/uL (ref 1.7–7.7)
Neutrophils Relative %: 76 %
Platelets: 285 10*3/uL (ref 150–400)
RBC: 4.18 MIL/uL (ref 3.87–5.11)
RDW: 12.2 % (ref 11.5–15.5)
WBC: 7.4 10*3/uL (ref 4.0–10.5)
nRBC: 0 % (ref 0.0–0.2)

## 2019-10-19 LAB — COMPREHENSIVE METABOLIC PANEL
ALT: 155 U/L — ABNORMAL HIGH (ref 0–44)
AST: 68 U/L — ABNORMAL HIGH (ref 15–41)
Albumin: 3.2 g/dL — ABNORMAL LOW (ref 3.5–5.0)
Alkaline Phosphatase: 123 U/L (ref 38–126)
Anion gap: 11 (ref 5–15)
BUN: 19 mg/dL (ref 6–20)
CO2: 27 mmol/L (ref 22–32)
Calcium: 8.6 mg/dL — ABNORMAL LOW (ref 8.9–10.3)
Chloride: 104 mmol/L (ref 98–111)
Creatinine, Ser: 0.63 mg/dL (ref 0.44–1.00)
GFR calc Af Amer: >60 mL/min (ref >60)
GFR calc non Af Amer: >60 mL/min (ref >60)
Glucose, Bld: 108 mg/dL — ABNORMAL HIGH (ref 70–99)
Potassium: 4 mmol/L (ref 3.5–5.1)
Sodium: 142 mmol/L (ref 135–145)
Total Bilirubin: 0.8 mg/dL (ref 0.3–1.2)
Total Protein: 6.2 g/dL — ABNORMAL LOW (ref 6.5–8.1)

## 2019-10-19 LAB — HEMOGLOBIN A1C
Hgb A1c MFr Bld: 5.5 % (ref 4.8–5.6)
Mean Plasma Glucose: 111.15 mg/dL

## 2019-10-19 LAB — LIPID PANEL
Cholesterol: 163 mg/dL (ref 0–200)
HDL: 27 mg/dL — ABNORMAL LOW (ref >40)
LDL Cholesterol: 110 mg/dL — ABNORMAL HIGH (ref 0–99)
Total CHOL/HDL Ratio: 6 RATIO
Triglycerides: 130 mg/dL (ref <150)
VLDL: 26 mg/dL (ref 0–40)

## 2019-10-19 LAB — BRAIN NATRIURETIC PEPTIDE: B Natriuretic Peptide: 43.9 pg/mL (ref 0.0–100.0)

## 2019-10-19 LAB — CULTURE, BLOOD (ROUTINE X 2): Special Requests: ADEQUATE

## 2019-10-19 LAB — D-DIMER, QUANTITATIVE: D-Dimer, Quant: 0.73 ug/mL-FEU — ABNORMAL HIGH (ref 0.00–0.50)

## 2019-10-19 LAB — C-REACTIVE PROTEIN: CRP: 2 mg/dL — ABNORMAL HIGH (ref <1.0)

## 2019-10-19 LAB — MAGNESIUM: Magnesium: 2.1 mg/dL (ref 1.7–2.4)

## 2019-10-19 LAB — PROCALCITONIN: Procalcitonin: <0.1 ng/mL

## 2019-10-19 LAB — HIV ANTIBODY (ROUTINE TESTING W REFLEX): HIV Screen 4th Generation wRfx: NONREACTIVE

## 2019-10-19 MED ORDER — ASPIRIN EC 325 MG PO TBEC
325.0000 mg | DELAYED_RELEASE_TABLET | Freq: Every day | ORAL | Status: DC
  Administered 2019-10-19 – 2019-10-22 (×4): 325 mg via ORAL
  Filled 2019-10-19 (×5): qty 1

## 2019-10-19 MED ORDER — LACTATED RINGERS IV SOLN: INTRAVENOUS | Status: DC

## 2019-10-19 MED ORDER — LORAZEPAM 2 MG/ML IJ SOLN: 1.0000 mg | Freq: Once | INTRAMUSCULAR | Status: DC | PRN

## 2019-10-19 MED ORDER — METHYLPREDNISOLONE SODIUM SUCC 40 MG IJ SOLR
30.0000 mg | Freq: Every day | INTRAMUSCULAR | Status: DC
  Administered 2019-10-19: 10:00:00 30 mg via INTRAVENOUS
  Filled 2019-10-19: qty 1

## 2019-10-19 MED ORDER — ATORVASTATIN CALCIUM 40 MG PO TABS
40.0000 mg | ORAL_TABLET | Freq: Every day | ORAL | Status: DC
  Administered 2019-10-19 – 2019-10-21 (×3): 40 mg via ORAL
  Filled 2019-10-19 (×2): qty 1

## 2019-10-19 MED ORDER — IOHEXOL 350 MG/ML SOLN
80.0000 mL | Freq: Once | INTRAVENOUS | Status: AC | PRN
  Administered 2019-10-19: 80 mL via INTRAVENOUS

## 2019-10-19 NOTE — Progress Notes (Signed)
PROGRESS NOTE                                                                                                                                                                                                             Patient Demographics:    Carol Lawson, is a 59 y.o. female, DOB - 01/14/1961, [id number]  Outpatient Primary MD for the patient is Antony Contras, MD    LOS - 3  Admit date - 10/16/2019    Chief Complaint  Patient presents with   Shortness of Breath    COVID+       Brief Narrative  Carol Lawson  is a 59 y.o. female, with H/O Benign mesenteric mass, GERD, HTN who was diagnosed with Covid 19 on the 26th of December outpt, presented to [facility] with mild cough, exertional SOB and fatigue on 10/16/19, her lab work was stable, she had exertional dyspnea and was sent to [facility] for Rx.   Subjective:    Carol Lawson today has, complain of headache and diplopia with both eyes, No chest pain, No abdominal pain - No Nausea, No new weakness tingling or numbness, no shortness of breath,   Assessment  & Plan :    Active Problems:   Pneumonia due to COVID-19 virus   Pneumonia   COVID-19 virus infection   1.   Acute Covid 19 Viral Pneumonitis during the ongoing 2020 Covid 19 Pandemic - she has mild disease and is being treated with IV steroids and remdesivir, no hypoxia or oxygen requirement.  Continue to monitor.  Encouraged the patient to sit up in chair in the daytime use I-S and flutter valve for pulmonary toiletry and then prone in bed when at night.    SpO2: 92 % O2 Flow Rate (L/min): 1 L/min  Recent Labs  Lab 10/16/19 1921 10/18/19 1203 10/19/19 0100 10/19/19 0120  CRP 6.4* 2.2* 2.0*  --   DDIMER 0.85* 0.94*  --  0.73*  FERRITIN 679*  --   --   --   BNP  --  39.1 43.9  --   PROCALCITON <0.10 <0.10  --  <0.10    Hepatic  Function Latest Ref Rng & Units 10/19/2019 10/16/2019 07/23/2018  Total Protein 6.5 - 8.1  g/dL 6.2(L) 7.7 6.2(L)  Albumin 3.5 - 5.0 g/dL 3.2(L) 3.7 3.6  AST 15 - 41 U/L 68(H) 77(H) 30  ALT 0 - 44 U/L 155(H) 100(H) 40  Alk Phosphatase 38 - 126 U/L 123 142(H) 65  Total Bilirubin 0.3 - 1.2 mg/dL 0.8 0.8 0.5    2.  Headache with diplopia.  Upon further questioning patient states that she has had off-and-on headache for several days even before hospitalization, also spoke to husband who said patient was taking her daughter's migraine cocktail with good effect.  And said that she had retro-orbital headache.  CT angiogram head and neck appears nonacute, will check MRI, if negative this could be atypical migraine or MS.  For now aspirin and statin.  A1c and lipid panel also ordered.  Will be monitored closely.  May require outpatient neuro and ophthalmology follow-up.   3.  Mild COVID-19 viral illness induced asymptomatic transaminitis.  Trend and monitor.   4. 1/2 Staph Lugdunensis Blood culture - contaminant.    Condition - fair  Family Communication  :  husband in detail on 10/19/2019  Code Status :  Full  Diet :   Diet Order            Diet Heart Room service appropriate? Yes; Fluid consistency: Thin  Diet effective now               Disposition Plan  :  Home  Consults  :    Procedures  :    MRI brain.  CT angiogram head and neck - nonacute.  PUD Prophylaxis : None  DVT Prophylaxis  :  Lovenox   Lab Results  Component Value Date   PLT 285 10/19/2019    Inpatient Medications  Scheduled Meds:  aspirin EC  325 mg Oral Daily   atorvastatin  40 mg Oral q1800   enoxaparin (LOVENOX) injection  40 mg Subcutaneous Q24H   methylPREDNISolone (SOLU-MEDROL) injection  30 mg Intravenous Daily   Continuous Infusions:  lactated ringers     remdesivir 100 mg in NS 100 mL 100 mg (10/19/19 0958)   PRN Meds:.acetaminophen, albuterol, HYDROcodone-acetaminophen, influenza vac split quadrivalent PF, LORazepam, ondansetron **OR** ondansetron (ZOFRAN) IV,  promethazine, senna-docusate  Antibiotics  :    Anti-infectives (From admission, onward)   Start     Dose/Rate Route Frequency Ordered Stop   10/18/19 1000  remdesivir 100 mg in sodium chloride 0.9 % 100 mL IVPB     100 mg 200 mL/hr over 30 Minutes Intravenous Daily 10/17/19 0037 10/22/19 0959   10/18/19 1000  vancomycin (VANCOREADY) IVPB 750 mg/150 mL  Status:  Discontinued     750 mg 150 mL/hr over 60 Minutes Intravenous Every 12 hours 10/18/19 0132 10/18/19 1120   10/18/19 0015  vancomycin (VANCOCIN) IVPB 1000 mg/200 mL premix     1,000 mg 200 mL/hr over 60 Minutes Intravenous  Once 10/18/19 0000 10/18/19 0155   10/17/19 0200  remdesivir 200 mg in sodium chloride 0.9% 250 mL IVPB     200 mg 580 mL/hr over 30 Minutes Intravenous Once 10/17/19 0037 10/17/19 0246       Time Spent in minutes  30   Lala Lund M.D on 10/19/2019 at 11:55 AM  To page go to [email or web address] - password [email or web address]  [facility] -  Office  [phone]  See all Orders from today for further details    Objective:  Vitals:   10/18/19 1530 10/18/19 1928 10/19/19 0341 10/19/19 0730  BP: 127/84 138/86 128/68 131/83  Pulse: 76 73 76 73  Resp: _0 Temp: 98.5 F (36.9 C) 98.4 F (36.9 C) 98.4 F (36.9 C) 98.6 F (37 C)  TempSrc: Oral Oral Oral Oral  SpO2: 95% 94% 96% 92%  Weight:      Height:        Wt Readings from Last 3 Encounters:  10/18/19 77.6 kg  07/23/18 74.8 kg  07/20/18 74.9 kg     Intake/Output Summary (Last 24 hours) at 10/19/2019 1155 Last data filed at 10/19/2019 0800 Gross per 24 hour  Intake 1702.72 ml  Output 5 ml  Net 1697.72 ml     Physical Exam  Awake Alert, mild diplopia ( 3 days she says) , No new F.N deficits, Normal affect [place].AT,PERRAL Supple Neck,No JVD, No cervical lymphadenopathy appriciated.  Symmetrical Chest wall movement, Good air movement bilaterally, CTAB RRR,No Gallops,Rubs or new Murmurs, No Parasternal Heave +ve B.Sounds, Abd Soft, No  tenderness, No organomegaly appriciated, No rebound - guarding or rigidity. No Cyanosis, Clubbing or edema, No new Rash or bruise       Data Review:    CBC Recent Labs  Lab 10/16/19 1921 10/18/19 1203 10/19/19 0120  WBC 5.8 9.4 7.4  HGB 14.5 14.1 12.7  HCT 42.7 41.9 37.9  PLT 243 292 285  MCV 89.9 90.5 90.7  MCH 30.5 30.5 30.4  MCHC 34.0 33.7 33.5  RDW 12.4 12.5 12.2  LYMPHSABS 1.3  --  1.0  MONOABS 0.7  --  0.7  EOSABS 0.0  --  0.0  BASOSABS 0.0  --  0.0    Chemistries  Recent Labs  Lab 10/16/19 1921 10/18/19 1203 10/19/19 0120  NA 140  --  142  K 3.3*  --  4.0  CL 101  --  104  CO2 27  --  27  GLUCOSE 109*  --  108*  BUN 13  --  19  CREATININE 0.82 0.59 0.63  CALCIUM 8.6*  --  8.6*  MG  --  2.2 2.1  AST 77*  --  68*  ALT 100*  --  155*  ALKPHOS 142*  --  123  BILITOT 0.8  --  0.8   ------------------------------------------------------------------------------------------------------------------ Recent Labs    10/16/19 1921 10/19/19 0120  CHOL  --  163  HDL  --  27*  LDLCALC  --  110*  TRIG 222* 130  CHOLHDL  --  6.0    No results found for: HGBA1C ------------------------------------------------------------------------------------------------------------------ No results for input(s): TSH, T4TOTAL, T3FREE, THYROIDAB in the last 72 hours.  Invalid input(s): FREET3  Cardiac Enzymes No results for input(s): CKMB, TROPONINI, MYOGLOBIN in the last 168 hours.  Invalid input(s): CK ------------------------------------------------------------------------------------------------------------------    Component Value Date/Time   BNP 43.9 10/19/2019 0100    Micro Results Recent Results (from the past 240 hour(s))  Blood Culture (routine x 2)     Status: Abnormal   Collection Time: 10/16/19  8:27 PM   Specimen: BLOOD  Result Value Ref Range Status   Specimen Description   Final    BLOOD LEFT ANTECUBITAL Performed at [facility], [address] [place], [place] [address]    Special Requests   Final    BOTTLES DRAWN AEROBIC AND ANAEROBIC Blood Culture adequate volume Performed at [facility], [address], [place], [place] [address]  Culture  Setup Time   Final    AEROBIC BOTTLE ONLY GRAM POSITIVE COCCI CRITICAL RESULT CALLED TO, READ BACK BY AND VERIFIED WITH: L ADKINS RN 10/17/19 2143 JDW Performed at [facility], [place] [address], [place], [place] [address]    Culture STAPHYLOCOCCUS LUGDUNENSIS (A)  Final   Report Status 10/19/2019 FINAL  Final   Organism ID, Bacteria STAPHYLOCOCCUS LUGDUNENSIS  Final      Susceptibility   Staphylococcus lugdunensis - MIC*    CIPROFLOXACIN 1 SENSITIVE Sensitive     ERYTHROMYCIN <=0.25 SENSITIVE Sensitive     GENTAMICIN <=0.5 SENSITIVE Sensitive     OXACILLIN 2 SENSITIVE Sensitive     TETRACYCLINE <=1 SENSITIVE Sensitive     VANCOMYCIN <=0.5 SENSITIVE Sensitive     TRIMETH/SULFA <=10 SENSITIVE Sensitive     CLINDAMYCIN <=0.25 SENSITIVE Sensitive     RIFAMPIN <=0.5 SENSITIVE Sensitive     Inducible Clindamycin NEGATIVE Sensitive     * STAPHYLOCOCCUS LUGDUNENSIS  Blood Culture (routine x 2)     Status: None (Preliminary result)   Collection Time: 10/16/19  8:44 PM   Specimen: BLOOD  Result Value Ref Range Status   Specimen Description   Final    BLOOD RIGHT ANTECUBITAL Performed at [facility], [address] [place], [place] [address]    Special Requests   Final    BOTTLES DRAWN AEROBIC AND ANAEROBIC Blood Culture adequate volume Performed at [facility], [address] [place], [place] [address]    Culture   Final    NO GROWTH 2 DAYS Performed at [facility], [place] [address], [place], [place] [address]    Report Status PENDING  Incomplete  SARS Coronavirus 2 Ag (30 min TAT) - Nasal Swab (BD Veritor Kit)     Status: Abnormal   Collection Time: 10/16/19 11:10 PM   Specimen: Nasal Swab (BD Veritor Kit)  Result Value  Ref Range Status   SARS Coronavirus 2 Ag POSITIVE (A) NEGATIVE Final    Comment: RESULT CALLED TO, READ BACK BY AND VERIFIED WITH: MAYNARD,C AT 2336 ON [id number] BY CHERESNOWSKY,T (NOTE) SARS-CoV-2 antigen PRESENT. Positive results indicate the presence of viral antigens, but clinical correlation with patient history and other diagnostic information is necessary to determine patient infection status.  Positive results do not rule out bacterial infection or co-infection  with other viruses. False positive results are rare but can occur, and confirmatory RT-PCR testing may be appropriate in some circumstances. The expected result is Negative. Fact Sheet for Patients: PodPark.tn Fact Sheet for Providers: GiftContent.is  This test is not yet approved or cleared by the [place] FDA and  has been authorized for detection and/or diagnosis of SARS-CoV-2 by FDA under an Emergency Use Authorization (EUA).  This EUA will remain in effect (meaning this test can be used) for the duration of  the COVID- 19 declaration under Section 564(b)(1) of the Act, 21 U.S.C. section 360bbb-3(b)(1), unless the authorization is terminated or revoked sooner. Performed at [facility], [address] [place], [place] [address]     Radiology Reports CT ANGIO HEAD W OR WO CONTRAST  Result Date: 10/19/2019 CLINICAL DATA:  Coronavirus infection.  Mental status changes. EXAM: CT ANGIOGRAPHY HEAD AND NECK TECHNIQUE: Multidetector CT imaging of the head and neck was performed using the standard protocol during bolus administration of intravenous contrast. Multiplanar CT image reconstructions and MIPs were obtained to evaluate the vascular anatomy. Carotid  stenosis measurements (when applicable) are obtained utilizing NASCET criteria, using the distal internal carotid diameter as the denominator. CONTRAST:  24m OMNIPAQUE IOHEXOL 350 MG/ML SOLN  COMPARISON:  None. FINDINGS: CT HEAD FINDINGS Brain: The brain shows a normal appearance without evidence of malformation, atrophy, old or acute small or large vessel infarction, mass lesion, hemorrhage, hydrocephalus or extra-axial collection. Vascular: No hyperdense vessel. No evidence of atherosclerotic calcification. Skull: Normal.  No traumatic finding.  No focal bone lesion. Sinuses/Orbits: Sinuses are clear. Orbits appear normal. Mastoids are clear. Other: None significant CTA NECK FINDINGS Aortic arch: Normal Right carotid system: Normal Left carotid system: Normal Vertebral arteries: Normal Skeleton: Normal Other neck: No mass or adenopathy. Upper chest: Patchy bilateral pulmonary infiltrates consistent with the clinical history of viral pneumonia. Review of the MIP images confirms the above findings CTA HEAD FINDINGS Anterior circulation: Both internal carotid arteries are patent through the skull base and siphon regions. No siphon stenosis. The anterior and middle cerebral vessels are patent without stenosis, aneurysm or vascular malformation. No large or medium vessel occlusion. Posterior circulation: Both vertebral arteries are patent through the foramen magnum. The left terminates in PICA. Right vertebral artery supplies the basilar. No basilar stenosis. Posterior circulation branch vessels are normal. Venous sinuses: Patent and normal. Anatomic variants: None significant. Review of the MIP images confirms the above findings IMPRESSION: Normal CT angiography of the head and neck vessels. No evidence of atherosclerotic disease. No large or medium vessel occlusion. Normal appearance of the brain itself. Patchy bilateral pulmonary infiltrates consistent with the clinical history of viral pneumonia. Electronically Signed   By: MNelson ChimesM.D.   On: 10/19/2019 11:48   CT ANGIO NECK W OR WO CONTRAST  Result Date: 10/19/2019 CLINICAL DATA:  Coronavirus infection.  Mental status changes. EXAM: CT  ANGIOGRAPHY HEAD AND NECK TECHNIQUE: Multidetector CT imaging of the head and neck was performed using the standard protocol during bolus administration of intravenous contrast. Multiplanar CT image reconstructions and MIPs were obtained to evaluate the vascular anatomy. Carotid stenosis measurements (when applicable) are obtained utilizing NASCET criteria, using the distal internal carotid diameter as the denominator. CONTRAST:  859mOMNIPAQUE IOHEXOL 350 MG/ML SOLN COMPARISON:  None. FINDINGS: CT HEAD FINDINGS Brain: The brain shows a normal appearance without evidence of malformation, atrophy, old or acute small or large vessel infarction, mass lesion, hemorrhage, hydrocephalus or extra-axial collection. Vascular: No hyperdense vessel. No evidence of atherosclerotic calcification. Skull: Normal.  No traumatic finding.  No focal bone lesion. Sinuses/Orbits: Sinuses are clear. Orbits appear normal. Mastoids are clear. Other: None significant CTA NECK FINDINGS Aortic arch: Normal Right carotid system: Normal Left carotid system: Normal Vertebral arteries: Normal Skeleton: Normal Other neck: No mass or adenopathy. Upper chest: Patchy bilateral pulmonary infiltrates consistent with the clinical history of viral pneumonia. Review of the MIP images confirms the above findings CTA HEAD FINDINGS Anterior circulation: Both internal carotid arteries are patent through the skull base and siphon regions. No siphon stenosis. The anterior and middle cerebral vessels are patent without stenosis, aneurysm or vascular malformation. No large or medium vessel occlusion. Posterior circulation: Both vertebral arteries are patent through the foramen magnum. The left terminates in PICA. Right vertebral artery supplies the basilar. No basilar stenosis. Posterior circulation branch vessels are normal. Venous sinuses: Patent and normal. Anatomic variants: None significant. Review of the MIP images confirms the above findings IMPRESSION:  Normal CT angiography of the head and neck vessels. No evidence of atherosclerotic disease. No large or medium  vessel occlusion. Normal appearance of the brain itself. Patchy bilateral pulmonary infiltrates consistent with the clinical history of viral pneumonia. Electronically Signed   By: Nelson Chimes M.D.   On: 10/19/2019 11:48   DG Chest Portable 1 View  Result Date: 10/16/2019 CLINICAL DATA:  COVID-19. Increasing shortness of breath. Weakness. Lethargy. EXAM: PORTABLE CHEST 1 VIEW COMPARISON:  11/14/2005 FINDINGS: Midline trachea. Normal heart size. No pleural effusion or pneumothorax. Mild right hemidiaphragm elevation. Mild interstitial opacities are suspected in the periphery of both lung bases. There may also be mild left midlung interstitial opacity. IMPRESSION: Suspicion of subtle lower lung predominant interstitial opacities, as can be seen with mild COVID-19 pneumonia. Electronically Signed   By: Abigail Miyamoto M.D.   On: 10/16/2019 19:38

## 2019-10-19 NOTE — Progress Notes (Signed)
Family Update  Spoke with patient spouse Darron concerning patient status, POC and discharge plan.

## 2019-10-20 DIAGNOSIS — H532 Diplopia: Secondary | ICD-10-CM

## 2019-10-20 LAB — COMPREHENSIVE METABOLIC PANEL
ALT: 113 U/L — ABNORMAL HIGH (ref 0–44)
AST: 36 U/L (ref 15–41)
Albumin: 3.2 g/dL — ABNORMAL LOW (ref 3.5–5.0)
Alkaline Phosphatase: 105 U/L (ref 38–126)
Anion gap: 10 (ref 5–15)
BUN: 20 mg/dL (ref 6–20)
CO2: 26 mmol/L (ref 22–32)
Calcium: 8.6 mg/dL — ABNORMAL LOW (ref 8.9–10.3)
Chloride: 103 mmol/L (ref 98–111)
Creatinine, Ser: 0.66 mg/dL (ref 0.44–1.00)
GFR calc Af Amer: >60 mL/min (ref >60)
GFR calc non Af Amer: >60 mL/min (ref >60)
Glucose, Bld: 95 mg/dL (ref 70–99)
Potassium: 3.7 mmol/L (ref 3.5–5.1)
Sodium: 139 mmol/L (ref 135–145)
Total Bilirubin: 0.4 mg/dL (ref 0.3–1.2)
Total Protein: 6.5 g/dL (ref 6.5–8.1)

## 2019-10-20 LAB — C-REACTIVE PROTEIN: CRP: 1.5 mg/dL — ABNORMAL HIGH (ref <1.0)

## 2019-10-20 LAB — CBC WITH DIFFERENTIAL/PLATELET
Abs Immature Granulocytes: 0.18 10*3/uL — ABNORMAL HIGH (ref 0.00–0.07)
Basophils Absolute: 0 10*3/uL (ref 0.0–0.1)
Basophils Relative: 0 %
Eosinophils Absolute: 0 10*3/uL (ref 0.0–0.5)
Eosinophils Relative: 0 %
HCT: 37.2 % (ref 36.0–46.0)
Hemoglobin: 12.4 g/dL (ref 12.0–15.0)
Immature Granulocytes: 2 %
Lymphocytes Relative: 18 %
Lymphs Abs: 1.6 10*3/uL (ref 0.7–4.0)
MCH: 30.2 pg (ref 26.0–34.0)
MCHC: 33.3 g/dL (ref 30.0–36.0)
MCV: 90.7 fL (ref 80.0–100.0)
Monocytes Absolute: 0.9 10*3/uL (ref 0.1–1.0)
Monocytes Relative: 9 %
Neutro Abs: 6.5 10*3/uL (ref 1.7–7.7)
Neutrophils Relative %: 71 %
Platelets: 303 10*3/uL (ref 150–400)
RBC: 4.1 MIL/uL (ref 3.87–5.11)
RDW: 12.2 % (ref 11.5–15.5)
WBC: 9.2 10*3/uL (ref 4.0–10.5)
nRBC: 0 % (ref 0.0–0.2)

## 2019-10-20 LAB — D-DIMER, QUANTITATIVE: D-Dimer, Quant: 0.51 ug/mL-FEU — ABNORMAL HIGH (ref 0.00–0.50)

## 2019-10-20 LAB — BRAIN NATRIURETIC PEPTIDE: B Natriuretic Peptide: 43.9 pg/mL (ref 0.0–100.0)

## 2019-10-20 LAB — PROCALCITONIN: Procalcitonin: <0.1 ng/mL

## 2019-10-20 LAB — MAGNESIUM: Magnesium: 2 mg/dL (ref 1.7–2.4)

## 2019-10-20 MED ORDER — KETOROLAC TROMETHAMINE 30 MG/ML IJ SOLN
30.0000 mg | Freq: Once | INTRAMUSCULAR | Status: AC
  Administered 2019-10-20: 30 mg via INTRAVENOUS
  Filled 2019-10-20: qty 1

## 2019-10-20 MED ORDER — PROMETHAZINE HCL 25 MG/ML IJ SOLN
25.0000 mg | Freq: Once | INTRAMUSCULAR | Status: AC
  Administered 2019-10-20: 25 mg via INTRAVENOUS
  Filled 2019-10-20: qty 1

## 2019-10-20 MED ORDER — METHYLPREDNISOLONE SODIUM SUCC 40 MG IJ SOLR
20.0000 mg | Freq: Every day | INTRAMUSCULAR | Status: DC
  Administered 2019-10-20 – 2019-10-22 (×3): 20 mg via INTRAVENOUS
  Filled 2019-10-20 (×3): qty 1

## 2019-10-20 MED ORDER — KETOROLAC TROMETHAMINE 30 MG/ML IJ SOLN
30.0000 mg | Freq: Once | INTRAMUSCULAR | Status: AC
  Administered 2019-10-21: 30 mg via INTRAVENOUS
  Filled 2019-10-20: qty 1

## 2019-10-20 MED ORDER — SODIUM CHLORIDE 0.9 % IV SOLN: 25.0000 mg | Freq: Once | INTRAVENOUS | Status: DC

## 2019-10-20 MED ORDER — DIPHENHYDRAMINE HCL 50 MG/ML IJ SOLN
25.0000 mg | Freq: Once | INTRAMUSCULAR | Status: AC
  Administered 2019-10-20: 25 mg via INTRAVENOUS
  Filled 2019-10-20: qty 1

## 2019-10-20 NOTE — Progress Notes (Signed)
Physical Therapy Treatment Patient Details Name: Carol Lawson MRN: [id number] DOB: [birth year] Today's Date: 10/20/2019    History of Present Illness 59 y.o. female, with H/O Benign mesenteric mass, GERD, HTN who was diagnosed with Covid 19 on the 26th of December outpt, presented to [facility] with mild cough, exertional SOB and fatigue on 10/16/19, her lab work was stable, she had exertional dyspnea and was sent to [facility] for Rx.    PT Comments    Pt is doing well functionally able to tolerate more with less assist. But she is still c/o dizziness and diplopia which she says is causing her to feel nausea. Therapist has asked nurse if an eye patch may be used to help with diplopia. Pt able to ambulate approx 264ft with min guard assist, for safety, and room air, sats remained in 90s throughout session.    Follow Up Recommendations  No PT follow up     Equipment Recommendations  None recommended by PT    Recommendations for Other Services       Precautions / Restrictions Precautions Precaution Comments: dizziness w/ diplopia Restrictions Weight Bearing Restrictions: No    Mobility  Bed Mobility Overal bed mobility: Modified Independent                Transfers Overall transfer level: Modified independent                  Ambulation/Gait Ambulation/Gait assistance: Min guard Gait Distance (Feet): 200 Feet Assistive device: 1 person hand held assist Gait Pattern/deviations: Step-through pattern     General Gait Details: on room air sats in 90s throughout   Stairs             Wheelchair Mobility    Modified Rankin (Stroke Patients Only)       Balance Overall balance assessment: No apparent balance deficits (not formally assessed)                                          Cognition Arousal/Alertness: Awake/alert Behavior During Therapy: WFL for tasks assessed/performed Overall Cognitive Status: Within Functional Limits for tasks  assessed                                        Exercises Other Exercises Other Exercises: sit<>stand x 10 w/o UE    General Comments        Pertinent Vitals/Pain Pain Assessment: No/denies pain(just headache)    Home Living                      Prior Function            PT Goals (current goals can now be found in the care plan section) Acute Rehab PT Goals Patient Stated Goal: states is doing well but is having diplopia which causes nausea and also headaches PT Goal Formulation: With patient Time For Goal Achievement: 11/01/19 Potential to Achieve Goals: Good Progress towards PT goals: Progressing toward goals    Frequency    Min 3X/week      PT Plan Current plan remains appropriate    Co-evaluation              AM-PAC PT "6 Clicks" Mobility   Outcome Measure  Help needed turning from your back to  your side while in a flat bed without using bedrails?: None Help needed moving from lying on your back to sitting on the side of a flat bed without using bedrails?: None Help needed moving to and from a bed to a chair (including a wheelchair)?: A Little Help needed standing up from a chair using your arms (e.g., wheelchair or bedside chair)?: None Help needed to walk in hospital room?: A Little Help needed climbing 3-5 steps with a railing? : A Little 6 Click Score: 21    End of Session   Activity Tolerance: Patient limited by fatigue Patient left: in bed;with call bell/phone within reach   PT Visit Diagnosis: Other abnormalities of gait and mobility (R26.89)     Time: [id number] PT Time Calculation (min) (ACUTE ONLY): 11 min  Charges:  $Gait Training: 8-22 mins                     Horald Chestnut, PT     Delford Field 10/20/2019, 4:02 PM

## 2019-10-20 NOTE — Consult Note (Addendum)
Requesting Physician: Dr. Candiss Norse    Chief Complaint: Double vision  History obtained from: Patient and Chart     HPI:                                                                                                                                       Carol Lawson is a 59 y.o. female with no significant PMH except HTN presents with SOB abd fatigue 2/2 COVID 19 infection admitted at [facility].  Patient states since 1/2 she has been having retro-orbital pain behind her right eye associated with double vision. She describes double vision as resolving on closing one eye, present throughout the day not worsening in the evenings and present on looking both direction as well as forward. No nystagmus or impaired eye movements per hospitalitist, she has has received Torodol and tramadol with little improvement of eye pain but no improvement in double vision.  She denies any history of migraines. Denies feeling of dry eyes. No floaters/keleidoscope like vision and denies other aura. Denies photophobia. On assessment, visual Cavanagh intact as well EOMI apart from painful movements in her right eye.     Past Medical History:  Diagnosis Date  . GERD (gastroesophageal reflux disease)   . Hypertension    not on medication    Past Surgical History:  Procedure Laterality Date  . ACL repair of knee  2006  . CHOLECYSTECTOMY  2006  . intestinal sx     age 17  . meniscus repair of knee  2007    Family History  Problem Relation Age of Onset  . Breast cancer Mother   . Heart attack Father   . Heart disease Father    Social History:  reports that she has never smoked. She has never used smokeless tobacco. She reports current alcohol use. She reports that she does not use drugs.  Allergies: No Known Allergies  Medications:                                                                                                                        I reviewed home medications   ROS:  14 systems reviewed and negative except above    Examination:                                                                                                      General: Appears well-developed and well-nourished.  Psych: Affect appropriate to situation Eyes: No scleral injection HENT: No OP obstrucion Head: Normocephalic.  Cardiovascular: Normal rate and regular rhythm.  Respiratory: Effort normal and breath sounds normal to anterior ascultation GI: Soft.  No distension. There is no tenderness.  Skin: WDI    Neurological Examination Mental Status: Alert, oriented, thought content appropriate.  Speech fluent without evidence of aphasia. Able to follow 3 step commands without difficulty. Cranial Nerves: II: Visual Brecheisen grossly normal,  III,IV, VI: ptosis not present, extra-ocular motions intact bilaterally, pupils equal, round, reactive to light and accommodation. NO RAPD seen.  V,VII: smile symmetric, facial light touch sensation normal bilaterally VIII: hearing normal bilaterally IX,X: uvula rises symmetrically XI: bilateral shoulder shrug XII: midline tongue extension Motor: Right : Upper extremity   5/5    Left:     Upper extremity   5/5  Lower extremity   5/5     Lower extremity   5/5 Tone and bulk:normal tone throughout; no atrophy noted Sensory: Pinprick and light touch intact throughout, bilaterally Deep Tendon Reflexes: 2+ and symmetric throughout Plantars: Right: downgoing   Left: downgoing Cerebellar: normal finger-to-nose, normal rapid alternating movements and normal heel-to-shin test Gait: normal gait and station     Lab Results: Basic Metabolic Panel: Recent Labs  Lab 10/16/19 1921 10/18/19 1203 10/19/19 0120 10/20/19 0005  NA 140  --  142 139  K 3.3*  --  4.0 3.7  CL 101  --  104 103  CO2 27  --  27 26  GLUCOSE 109*  --  108* 95  BUN 13  --  19  20  CREATININE 0.82 0.59 0.63 0.66  CALCIUM 8.6*  --  8.6* 8.6*  MG  --  2.2 2.1 2.0    CBC: Recent Labs  Lab 10/16/19 1921 10/18/19 1203 10/19/19 0120 10/20/19 0005  WBC 5.8 9.4 7.4 9.2  NEUTROABS 3.6  --  5.7 6.5  HGB 14.5 14.1 12.7 12.4  HCT 42.7 41.9 37.9 37.2  MCV 89.9 90.5 90.7 90.7  PLT 243 292 285 303    Coagulation Studies: No results for input(s): LABPROT, INR in the last 72 hours.  Imaging: CT ANGIO HEAD W OR WO CONTRAST  Result Date: 10/19/2019 CLINICAL DATA:  Coronavirus infection.  Mental status changes. EXAM: CT ANGIOGRAPHY HEAD AND NECK TECHNIQUE: Multidetector CT imaging of the head and neck was performed using the standard protocol during bolus administration of intravenous contrast. Multiplanar CT image reconstructions and MIPs were obtained to evaluate the vascular anatomy. Carotid stenosis measurements (when applicable) are obtained utilizing NASCET criteria, using the distal internal carotid diameter as the denominator. CONTRAST:  96mL OMNIPAQUE IOHEXOL 350 MG/ML SOLN COMPARISON:  None. FINDINGS: CT HEAD FINDINGS Brain: The brain shows a normal appearance without evidence of malformation, atrophy, old or acute small  or large vessel infarction, mass lesion, hemorrhage, hydrocephalus or extra-axial collection. Vascular: No hyperdense vessel. No evidence of atherosclerotic calcification. Skull: Normal.  No traumatic finding.  No focal bone lesion. Sinuses/Orbits: Sinuses are clear. Orbits appear normal. Mastoids are clear. Other: None significant CTA NECK FINDINGS Aortic arch: Normal Right carotid system: Normal Left carotid system: Normal Vertebral arteries: Normal Skeleton: Normal Other neck: No mass or adenopathy. Upper chest: Patchy bilateral pulmonary infiltrates consistent with the clinical history of viral pneumonia. Review of the MIP images confirms the above findings CTA HEAD FINDINGS Anterior circulation: Both internal carotid arteries are patent through the  skull base and siphon regions. No siphon stenosis. The anterior and middle cerebral vessels are patent without stenosis, aneurysm or vascular malformation. No large or medium vessel occlusion. Posterior circulation: Both vertebral arteries are patent through the foramen magnum. The left terminates in PICA. Right vertebral artery supplies the basilar. No basilar stenosis. Posterior circulation branch vessels are normal. Venous sinuses: Patent and normal. Anatomic variants: None significant. Review of the MIP images confirms the above findings IMPRESSION: Normal CT angiography of the head and neck vessels. No evidence of atherosclerotic disease. No large or medium vessel occlusion. Normal appearance of the brain itself. Patchy bilateral pulmonary infiltrates consistent with the clinical history of viral pneumonia. Electronically Signed   By: Nelson Chimes M.D.   On: 10/19/2019 11:48   CT ANGIO NECK W OR WO CONTRAST  Result Date: 10/19/2019 CLINICAL DATA:  Coronavirus infection.  Mental status changes. EXAM: CT ANGIOGRAPHY HEAD AND NECK TECHNIQUE: Multidetector CT imaging of the head and neck was performed using the standard protocol during bolus administration of intravenous contrast. Multiplanar CT image reconstructions and MIPs were obtained to evaluate the vascular anatomy. Carotid stenosis measurements (when applicable) are obtained utilizing NASCET criteria, using the distal internal carotid diameter as the denominator. CONTRAST:  91mL OMNIPAQUE IOHEXOL 350 MG/ML SOLN COMPARISON:  None. FINDINGS: CT HEAD FINDINGS Brain: The brain shows a normal appearance without evidence of malformation, atrophy, old or acute small or large vessel infarction, mass lesion, hemorrhage, hydrocephalus or extra-axial collection. Vascular: No hyperdense vessel. No evidence of atherosclerotic calcification. Skull: Normal.  No traumatic finding.  No focal bone lesion. Sinuses/Orbits: Sinuses are clear. Orbits appear normal. Mastoids  are clear. Other: None significant CTA NECK FINDINGS Aortic arch: Normal Right carotid system: Normal Left carotid system: Normal Vertebral arteries: Normal Skeleton: Normal Other neck: No mass or adenopathy. Upper chest: Patchy bilateral pulmonary infiltrates consistent with the clinical history of viral pneumonia. Review of the MIP images confirms the above findings CTA HEAD FINDINGS Anterior circulation: Both internal carotid arteries are patent through the skull base and siphon regions. No siphon stenosis. The anterior and middle cerebral vessels are patent without stenosis, aneurysm or vascular malformation. No large or medium vessel occlusion. Posterior circulation: Both vertebral arteries are patent through the foramen magnum. The left terminates in PICA. Right vertebral artery supplies the basilar. No basilar stenosis. Posterior circulation branch vessels are normal. Venous sinuses: Patent and normal. Anatomic variants: None significant. Review of the MIP images confirms the above findings IMPRESSION: Normal CT angiography of the head and neck vessels. No evidence of atherosclerotic disease. No large or medium vessel occlusion. Normal appearance of the brain itself. Patchy bilateral pulmonary infiltrates consistent with the clinical history of viral pneumonia. Electronically Signed   By: Nelson Chimes M.D.   On: 10/19/2019 11:48   MR BRAIN WO CONTRAST  Result Date: 10/19/2019 CLINICAL DATA:  New onset diplopia.  Recent coronavirus infection. EXAM: MRI HEAD WITHOUT CONTRAST TECHNIQUE: Multiplanar, multiecho pulse sequences of the brain and surrounding structures were obtained without intravenous contrast. COMPARISON:  CT angiography same day FINDINGS: Brain: The brain has a normal appearance without evidence of malformation, atrophy, old or acute small or large vessel infarction, mass lesion, hemorrhage, hydrocephalus or extra-axial collection. Vascular: Major vessels at the base of the brain show flow.  Venous sinuses appear patent. Skull and upper cervical spine: Normal. Sinuses/Orbits: Clear/normal. Other: None significant. IMPRESSION: Normal examination. No abnormality seen to explain the clinical presentation. No sequela of coronavirus infection identified. Electronically Signed   By: Nelson Chimes M.D.   On: 10/19/2019 12:48     I have reviewed the above imaging  MRI brain w/o contrast and CTA head and neck: unremarkable   ASSESSMENT AND PLAN  59 y.o. female with no significant PMH except HTN presents with SOB abd fatigue 2/2 COVID 19 infection admitted at [facility] with Right eye pain associated with eye movement as well diplopia.  EOMI intact, Visual Bodey normal and pupils are equal and reactive with no RAPD.  Impression  Binocular Diplopia  Retro-robital eye pain in the right eye  Recommendations   -  MRI Aaron Edelman w/ contrast along with MRI orbits w/wo contrast to evaluate for optic neurits, cavernous thrombosis,[place] hunt etc - Consider repeating migraine cocktail, Toradol, Mg, Comapazine along with Depakone IV 1g. - Consider Anti AChR antibodies  - Outpatient Ophthalmology consult    Sahra Converse [facility] Pager Number [phone]

## 2019-10-20 NOTE — Progress Notes (Signed)
PROGRESS NOTE                                                                                                                                                                                                             Patient Demographics:    Carol Lawson, is a 59 y.o. female, DOB - 03-27-1961, RAQ:[id number]  Outpatient Primary MD for the patient is Antony Contras, MD    LOS - 4  Admit date - 10/16/2019    Chief Complaint  Patient presents with  . Shortness of Breath    COVID+       Brief Narrative  Carol Lawson  is a 59 y.o. female, with H/O Benign mesenteric mass, GERD, HTN who was diagnosed with Covid 19 on the 26th of December outpt, presented to [facility] with mild cough, exertional SOB and fatigue on 10/16/19, her lab work was stable, she had exertional dyspnea and was sent to [facility] for Rx.   Subjective:   Patient in chair appears comfortable, denies any chest pain abdominal pain or shortness of breath, continues to have right-sided retro-orbital headache with some diplopia   Assessment  & Plan :      1.   Acute Covid 19 Viral Pneumonitis during the ongoing 2020 Covid 19 Pandemic - she has mild disease and is being treated with IV steroids and remdesivir, no hypoxia or oxygen requirement.  Continue to monitor.  Encouraged the patient to sit up in chair in the daytime use I-S and flutter valve for pulmonary toiletry and then prone in bed when at night.    SpO2: 96 % O2 Flow Rate (L/min): 1 L/min  Recent Labs  Lab 10/16/19 1921 10/18/19 1203 10/19/19 0100 10/19/19 0120 10/20/19 0005  CRP 6.4* 2.2* 2.0*  --  1.5*  DDIMER 0.85* 0.94*  --  0.73* 0.51*  FERRITIN 679*  --   --   --   --   BNP  --  39.1 43.9  --  43.9  PROCALCITON <0.10 <0.10  --  <0.10 <0.10    Hepatic Function Latest Ref Rng & Units 10/20/2019 10/19/2019 10/16/2019  Total Protein 6.5 - 8.1 g/dL 6.5 6.2(L) 7.7  Albumin 3.5 - 5.0 g/dL 3.2(L) 3.2(L) 3.7    AST 15 - 41 U/L 36 68(H) 77(H)  ALT 0 - 44 U/L 113(H) 155(H) 100(H)  Alk Phosphatase 38 - 126 U/L 105 123 142(H)  Total Bilirubin 0.3 - 1.2 mg/dL 0.4 0.8 0.8    2.  Right-sided retro-orbital headache ongoing for 7 to 10 days with diplopia.  CT angiogram head and neck along with MRI brain unremarkable.  Some improvement in headache with migraine cocktail but diplopia continues, question if this is optic neuritis or atypical migraine, case discussed with neurologist Dr. Londell Moh who will review the chart and get back to [place].  3.  Mild COVID-19 viral illness induced asymptomatic transaminitis.  Trend improving and monitor.   4. 1/2 Staph Lugdunensis Blood culture - contaminant.    Condition - fair  Family Communication  :  husband in detail on 10/19/2019  Code Status :  Full  Diet :   Diet Order            Diet Heart Room service appropriate? Yes; Fluid consistency: Thin  Diet effective now               Disposition Plan  :  Home  Consults  :    Procedures  :    MRI brain.  CT angiogram head and neck - nonacute.  PUD Prophylaxis : None  DVT Prophylaxis  :  Lovenox   Lab Results  Component Value Date   PLT 303 10/20/2019    Inpatient Medications  Scheduled Meds: . aspirin EC  325 mg Oral Daily  . atorvastatin  40 mg Oral q1800  . enoxaparin (LOVENOX) injection  40 mg Subcutaneous Q24H  . methylPREDNISolone (SOLU-MEDROL) injection  20 mg Intravenous Daily   Continuous Infusions: . remdesivir 100 mg in NS 100 mL 100 mg (10/20/19 0931)   PRN Meds:.acetaminophen, albuterol, influenza vac split quadrivalent PF, LORazepam, [DISCONTINUED] ondansetron **OR** ondansetron (ZOFRAN) IV, senna-docusate  Antibiotics  :    Anti-infectives (From admission, onward)   Start     Dose/Rate Route Frequency Ordered Stop   10/18/19 1000  remdesivir 100 mg in sodium chloride 0.9 % 100 mL IVPB     100 mg 200 mL/hr over 30 Minutes Intravenous Daily 10/17/19 0037 10/22/19 0959    10/18/19 1000  vancomycin (VANCOREADY) IVPB 750 mg/150 mL  Status:  Discontinued     750 mg 150 mL/hr over 60 Minutes Intravenous Every 12 hours 10/18/19 0132 10/18/19 1120   10/18/19 0015  vancomycin (VANCOCIN) IVPB 1000 mg/200 mL premix     1,000 mg 200 mL/hr over 60 Minutes Intravenous  Once 10/18/19 0000 10/18/19 0155   10/17/19 0200  remdesivir 200 mg in sodium chloride 0.9% 250 mL IVPB     200 mg 580 mL/hr over 30 Minutes Intravenous Once 10/17/19 0037 10/17/19 0246       Time Spent in minutes  30   Lala Lund M.D on 10/20/2019 at 10:59 AM  To page go to [email or web address] - password [email or web address]  [facility] -  Office  [phone]  See all Orders from today for further details    Objective:   Vitals:   10/19/19 1952 10/20/19 0005 10/20/19 0416 10/20/19 0733  BP: (!) 144/83 (!) 142/81 (!) 146/89 138/81  Pulse: 76 81 90 85  Resp: 18  20 20   Temp: 98.2 F (36.8 C)  98.2 F (36.8 C) 98 F (36.7 C)  TempSrc: Oral  Oral Oral  SpO2: 93% 93% 94% 96%  Weight:      Height:        Wt Readings from Last 3 Encounters:  10/18/19 77.6 kg  07/23/18  74.8 kg  07/20/18 74.9 kg     Intake/Output Summary (Last 24 hours) at 10/20/2019 1059 Last data filed at 10/20/2019 0931 Gross per 24 hour  Intake 1760 ml  Output 300 ml  Net 1460 ml     Physical Exam  Awake Alert, mild diplopia  , No new F.N deficits, Normal affect [place].AT,PERRAL Supple Neck,No JVD, No cervical lymphadenopathy appriciated.  Symmetrical Chest wall movement, Good air movement bilaterally, CTAB RRR,No Gallops, Rubs or new Murmurs, No Parasternal Heave +ve B.Sounds, Abd Soft, No tenderness, No organomegaly appriciated, No rebound - guarding or rigidity. No Cyanosis, Clubbing or edema, No new Rash or bruise       Data Review:    CBC Recent Labs  Lab 10/16/19 1921 10/18/19 1203 10/19/19 0120 10/20/19 0005  WBC 5.8 9.4 7.4 9.2  HGB 14.5 14.1 12.7 12.4  HCT 42.7 41.9 37.9 37.2  PLT 243 292 285 303    MCV 89.9 90.5 90.7 90.7  MCH 30.5 30.5 30.4 30.2  MCHC 34.0 33.7 33.5 33.3  RDW 12.4 12.5 12.2 12.2  LYMPHSABS 1.3  --  1.0 1.6  MONOABS 0.7  --  0.7 0.9  EOSABS 0.0  --  0.0 0.0  BASOSABS 0.0  --  0.0 0.0    Chemistries  Recent Labs  Lab 10/16/19 1921 10/18/19 1203 10/19/19 0120 10/20/19 0005  NA 140  --  142 139  K 3.3*  --  4.0 3.7  CL 101  --  104 103  CO2 27  --  27 26  GLUCOSE 109*  --  108* 95  BUN 13  --  19 20  CREATININE 0.82 0.59 0.63 0.66  CALCIUM 8.6*  --  8.6* 8.6*  MG  --  2.2 2.1 2.0  AST 77*  --  68* 36  ALT 100*  --  155* 113*  ALKPHOS 142*  --  123 105  BILITOT 0.8  --  0.8 0.4   ------------------------------------------------------------------------------------------------------------------ Recent Labs    10/19/19 0120  CHOL 163  HDL 27*  LDLCALC 110*  TRIG 130  CHOLHDL 6.0    Lab Results  Component Value Date   HGBA1C 5.5 10/19/2019   ------------------------------------------------------------------------------------------------------------------ No results for input(s): TSH, T4TOTAL, T3FREE, THYROIDAB in the last 72 hours.  Invalid input(s): FREET3  Cardiac Enzymes No results for input(s): CKMB, TROPONINI, MYOGLOBIN in the last 168 hours.  Invalid input(s): CK ------------------------------------------------------------------------------------------------------------------    Component Value Date/Time   BNP 43.9 10/20/2019 0005    Micro Results Recent Results (from the past 240 hour(s))  Blood Culture (routine x 2)     Status: Abnormal   Collection Time: 10/16/19  8:27 PM   Specimen: BLOOD  Result Value Ref Range Status   Specimen Description   Final    BLOOD LEFT ANTECUBITAL Performed at [facility], [address] [place], [place] [address]    Special Requests   Final    BOTTLES DRAWN AEROBIC AND ANAEROBIC Blood Culture adequate volume Performed at [facility], [address] [place], [place] [address]    Culture  Setup Time   Final    AEROBIC BOTTLE ONLY GRAM POSITIVE COCCI CRITICAL RESULT CALLED TO, READ BACK BY AND VERIFIED WITH: L ADKINS RN 10/17/19 2143 JDW Performed at [facility], [place] [address] [place],  [address]    Culture STAPHYLOCOCCUS LUGDUNENSIS (A)  Final   Report Status 10/19/2019 FINAL  Final   Organism ID,  Bacteria STAPHYLOCOCCUS LUGDUNENSIS  Final      Susceptibility   Staphylococcus lugdunensis - MIC*    CIPROFLOXACIN 1 SENSITIVE Sensitive     ERYTHROMYCIN <=0.25 SENSITIVE Sensitive     GENTAMICIN <=0.5 SENSITIVE Sensitive     OXACILLIN 2 SENSITIVE Sensitive     TETRACYCLINE <=1 SENSITIVE Sensitive     VANCOMYCIN <=0.5 SENSITIVE Sensitive     TRIMETH/SULFA <=10 SENSITIVE Sensitive     CLINDAMYCIN <=0.25 SENSITIVE Sensitive     RIFAMPIN <=0.5 SENSITIVE Sensitive     Inducible Clindamycin NEGATIVE Sensitive     * STAPHYLOCOCCUS LUGDUNENSIS  Blood Culture (routine x 2)     Status: None (Preliminary result)   Collection Time: 10/16/19  8:44 PM   Specimen: BLOOD  Result Value Ref Range Status   Specimen Description   Final    BLOOD RIGHT ANTECUBITAL Performed at [facility], [address] [place], [place] [address]    Special Requests   Final    BOTTLES DRAWN AEROBIC AND ANAEROBIC Blood Culture adequate volume Performed at [facility], [address] [place], [place] [address]    Culture   Final    NO GROWTH 3 DAYS Performed at [facility], [place] [address], [place], [place] [address]    Report Status PENDING  Incomplete  SARS Coronavirus 2 Ag (30 min TAT) - Nasal Swab (BD Veritor Kit)     Status: Abnormal   Collection Time: 10/16/19 11:10 PM   Specimen: Nasal Swab (BD Veritor Kit)  Result Value Ref Range Status   SARS Coronavirus 2 Ag POSITIVE (A) NEGATIVE Final    Comment: RESULT CALLED TO, READ BACK BY AND VERIFIED WITH: MAYNARD,C AT 2336 ON [id number] BY CHERESNOWSKY,T (NOTE) SARS-CoV-2  antigen PRESENT. Positive results indicate the presence of viral antigens, but clinical correlation with patient history and other diagnostic information is necessary to determine patient infection status.  Positive results do not rule out bacterial infection or co-infection  with other viruses. False positive results are rare but can occur, and confirmatory RT-PCR testing may be appropriate in some circumstances. The expected result is Negative. Fact Sheet for Patients: PodPark.tn Fact Sheet for Providers: GiftContent.is  This test is not yet approved or cleared by the [place] FDA and  has been authorized for detection and/or diagnosis of SARS-CoV-2 by FDA under an Emergency Use Authorization (EUA).  This EUA will remain in effect (meaning this test can be used) for the duration of  the COVID- 19 declaration under Section 564(b)(1) of the Act, 21 U.S.C. section 360bbb-3(b)(1), unless the authorization is terminated or revoked sooner. Performed at [facility], [address] [place], [place] [address]     Radiology Reports CT ANGIO HEAD W OR WO CONTRAST  Result Date: 10/19/2019 CLINICAL DATA:  Coronavirus infection.  Mental status changes. EXAM: CT ANGIOGRAPHY HEAD AND NECK TECHNIQUE: Multidetector CT imaging of the head and neck was performed using the standard protocol during bolus administration of intravenous contrast. Multiplanar CT image reconstructions and MIPs were obtained to evaluate the vascular anatomy. Carotid stenosis measurements (when applicable) are obtained utilizing NASCET criteria, using the distal internal carotid diameter as the denominator. CONTRAST:  60m OMNIPAQUE IOHEXOL 350 MG/ML SOLN COMPARISON:  None. FINDINGS: CT HEAD FINDINGS Brain: The brain shows a normal appearance without evidence of malformation, atrophy, old or acute small or large vessel infarction, mass lesion, hemorrhage,  hydrocephalus or extra-axial collection. Vascular: No hyperdense vessel. No  evidence of atherosclerotic calcification. Skull: Normal.  No traumatic finding.  No focal bone lesion. Sinuses/Orbits: Sinuses are clear. Orbits appear normal. Mastoids are clear. Other: None significant CTA NECK FINDINGS Aortic arch: Normal Right carotid system: Normal Left carotid system: Normal Vertebral arteries: Normal Skeleton: Normal Other neck: No mass or adenopathy. Upper chest: Patchy bilateral pulmonary infiltrates consistent with the clinical history of viral pneumonia. Review of the MIP images confirms the above findings CTA HEAD FINDINGS Anterior circulation: Both internal carotid arteries are patent through the skull base and siphon regions. No siphon stenosis. The anterior and middle cerebral vessels are patent without stenosis, aneurysm or vascular malformation. No large or medium vessel occlusion. Posterior circulation: Both vertebral arteries are patent through the foramen magnum. The left terminates in PICA. Right vertebral artery supplies the basilar. No basilar stenosis. Posterior circulation branch vessels are normal. Venous sinuses: Patent and normal. Anatomic variants: None significant. Review of the MIP images confirms the above findings IMPRESSION: Normal CT angiography of the head and neck vessels. No evidence of atherosclerotic disease. No large or medium vessel occlusion. Normal appearance of the brain itself. Patchy bilateral pulmonary infiltrates consistent with the clinical history of viral pneumonia. Electronically Signed   By: Nelson Chimes M.D.   On: 10/19/2019 11:48   CT ANGIO NECK W OR WO CONTRAST  Result Date: 10/19/2019 CLINICAL DATA:  Coronavirus infection.  Mental status changes. EXAM: CT ANGIOGRAPHY HEAD AND NECK TECHNIQUE: Multidetector CT imaging of the head and neck was performed using the standard protocol during bolus administration of intravenous contrast. Multiplanar CT image  reconstructions and MIPs were obtained to evaluate the vascular anatomy. Carotid stenosis measurements (when applicable) are obtained utilizing NASCET criteria, using the distal internal carotid diameter as the denominator. CONTRAST:  39m OMNIPAQUE IOHEXOL 350 MG/ML SOLN COMPARISON:  None. FINDINGS: CT HEAD FINDINGS Brain: The brain shows a normal appearance without evidence of malformation, atrophy, old or acute small or large vessel infarction, mass lesion, hemorrhage, hydrocephalus or extra-axial collection. Vascular: No hyperdense vessel. No evidence of atherosclerotic calcification. Skull: Normal.  No traumatic finding.  No focal bone lesion. Sinuses/Orbits: Sinuses are clear. Orbits appear normal. Mastoids are clear. Other: None significant CTA NECK FINDINGS Aortic arch: Normal Right carotid system: Normal Left carotid system: Normal Vertebral arteries: Normal Skeleton: Normal Other neck: No mass or adenopathy. Upper chest: Patchy bilateral pulmonary infiltrates consistent with the clinical history of viral pneumonia. Review of the MIP images confirms the above findings CTA HEAD FINDINGS Anterior circulation: Both internal carotid arteries are patent through the skull base and siphon regions. No siphon stenosis. The anterior and middle cerebral vessels are patent without stenosis, aneurysm or vascular malformation. No large or medium vessel occlusion. Posterior circulation: Both vertebral arteries are patent through the foramen magnum. The left terminates in PICA. Right vertebral artery supplies the basilar. No basilar stenosis. Posterior circulation branch vessels are normal. Venous sinuses: Patent and normal. Anatomic variants: None significant. Review of the MIP images confirms the above findings IMPRESSION: Normal CT angiography of the head and neck vessels. No evidence of atherosclerotic disease. No large or medium vessel occlusion. Normal appearance of the brain itself. Patchy bilateral pulmonary  infiltrates consistent with the clinical history of viral pneumonia. Electronically Signed   By: MNelson ChimesM.D.   On: 10/19/2019 11:48   MR BRAIN WO CONTRAST  Result Date: 10/19/2019 CLINICAL DATA:  New onset diplopia.  Recent coronavirus infection. EXAM: MRI HEAD WITHOUT CONTRAST TECHNIQUE: Multiplanar, multiecho pulse sequences of the  brain and surrounding structures were obtained without intravenous contrast. COMPARISON:  CT angiography same day FINDINGS: Brain: The brain has a normal appearance without evidence of malformation, atrophy, old or acute small or large vessel infarction, mass lesion, hemorrhage, hydrocephalus or extra-axial collection. Vascular: Major vessels at the base of the brain show flow. Venous sinuses appear patent. Skull and upper cervical spine: Normal. Sinuses/Orbits: Clear/normal. Other: None significant. IMPRESSION: Normal examination. No abnormality seen to explain the clinical presentation. No sequela of coronavirus infection identified. Electronically Signed   By: Nelson Chimes M.D.   On: 10/19/2019 12:48   DG Chest Portable 1 View  Result Date: 10/16/2019 CLINICAL DATA:  COVID-19. Increasing shortness of breath. Weakness. Lethargy. EXAM: PORTABLE CHEST 1 VIEW COMPARISON:  11/14/2005 FINDINGS: Midline trachea. Normal heart size. No pleural effusion or pneumothorax. Mild right hemidiaphragm elevation. Mild interstitial opacities are suspected in the periphery of both lung bases. There may also be mild left midlung interstitial opacity. IMPRESSION: Suspicion of subtle lower lung predominant interstitial opacities, as can be seen with mild COVID-19 pneumonia. Electronically Signed   By: Abigail Miyamoto M.D.   On: 10/16/2019 19:38

## 2019-10-21 ENCOUNTER — Inpatient Hospital Stay (HOSPITAL_COMMUNITY): Payer: No Typology Code available for payment source

## 2019-10-21 LAB — COMPREHENSIVE METABOLIC PANEL
ALT: 102 U/L — ABNORMAL HIGH (ref 0–44)
AST: 35 U/L (ref 15–41)
Albumin: 3.4 g/dL — ABNORMAL LOW (ref 3.5–5.0)
Alkaline Phosphatase: 102 U/L (ref 38–126)
Anion gap: 11 (ref 5–15)
BUN: 22 mg/dL — ABNORMAL HIGH (ref 6–20)
CO2: 28 mmol/L (ref 22–32)
Calcium: 8.3 mg/dL — ABNORMAL LOW (ref 8.9–10.3)
Chloride: 101 mmol/L (ref 98–111)
Creatinine, Ser: 0.71 mg/dL (ref 0.44–1.00)
GFR calc Af Amer: >60 mL/min (ref >60)
GFR calc non Af Amer: >60 mL/min (ref >60)
Glucose, Bld: 87 mg/dL (ref 70–99)
Potassium: 3.5 mmol/L (ref 3.5–5.1)
Sodium: 140 mmol/L (ref 135–145)
Total Bilirubin: 0.9 mg/dL (ref 0.3–1.2)
Total Protein: 6.6 g/dL (ref 6.5–8.1)

## 2019-10-21 LAB — CBC WITH DIFFERENTIAL/PLATELET
Abs Immature Granulocytes: 0.15 10*3/uL — ABNORMAL HIGH (ref 0.00–0.07)
Basophils Absolute: 0 10*3/uL (ref 0.0–0.1)
Basophils Relative: 1 %
Eosinophils Absolute: 0 10*3/uL (ref 0.0–0.5)
Eosinophils Relative: 0 %
HCT: 38.2 % (ref 36.0–46.0)
Hemoglobin: 12.6 g/dL (ref 12.0–15.0)
Immature Granulocytes: 2 %
Lymphocytes Relative: 17 %
Lymphs Abs: 1.4 10*3/uL (ref 0.7–4.0)
MCH: 30.1 pg (ref 26.0–34.0)
MCHC: 33 g/dL (ref 30.0–36.0)
MCV: 91.2 fL (ref 80.0–100.0)
Monocytes Absolute: 0.7 10*3/uL (ref 0.1–1.0)
Monocytes Relative: 8 %
Neutro Abs: 6.2 10*3/uL (ref 1.7–7.7)
Neutrophils Relative %: 72 %
Platelets: 290 10*3/uL (ref 150–400)
RBC: 4.19 MIL/uL (ref 3.87–5.11)
RDW: 12.3 % (ref 11.5–15.5)
WBC: 8.5 10*3/uL (ref 4.0–10.5)
nRBC: 0 % (ref 0.0–0.2)

## 2019-10-21 LAB — CULTURE, BLOOD (ROUTINE X 2)
Culture: NO GROWTH
Special Requests: ADEQUATE

## 2019-10-21 LAB — C-REACTIVE PROTEIN: CRP: 1.1 mg/dL — ABNORMAL HIGH (ref <1.0)

## 2019-10-21 LAB — MAGNESIUM: Magnesium: 2.1 mg/dL (ref 1.7–2.4)

## 2019-10-21 LAB — D-DIMER, QUANTITATIVE: D-Dimer, Quant: 0.56 ug/mL-FEU — ABNORMAL HIGH (ref 0.00–0.50)

## 2019-10-21 LAB — BRAIN NATRIURETIC PEPTIDE: B Natriuretic Peptide: 35.9 pg/mL (ref 0.0–100.0)

## 2019-10-21 MED ORDER — LOPERAMIDE HCL 2 MG PO CAPS
2.0000 mg | ORAL_CAPSULE | Freq: Four times a day (QID) | ORAL | Status: DC | PRN
  Administered 2019-10-21: 2 mg via ORAL
  Filled 2019-10-21: qty 1

## 2019-10-21 MED ORDER — TRAMADOL HCL 50 MG PO TABS
50.0000 mg | ORAL_TABLET | Freq: Once | ORAL | Status: AC
  Administered 2019-10-21: 50 mg via ORAL
  Filled 2019-10-21: qty 1

## 2019-10-21 MED ORDER — GADOBUTROL 1 MMOL/ML IV SOLN
7.5000 mL | Freq: Once | INTRAVENOUS | Status: AC | PRN
  Administered 2019-10-21: 7.5 mL via INTRAVENOUS

## 2019-10-21 NOTE — Progress Notes (Signed)
RN transported patient by wheelchair from MRI to room 1432. Patient is now c/o pain rated 6/10. Patient requested Toradol. PCP on call was notified.

## 2019-10-21 NOTE — Progress Notes (Signed)
PROGRESS NOTE                                                                                                                                                                                                             Patient Demographics:    Carol Lawson, is a 59 y.o. female, DOB - [birth year], ZHG:[id number]  Outpatient Primary MD for the patient is Antony Contras, MD    LOS - 5  Admit date - 10/16/2019    Chief Complaint  Patient presents with  . Shortness of Breath    COVID+       Brief Narrative  Carol Lawson  is a 59 y.o. female, with H/O Benign mesenteric mass, GERD, HTN who was diagnosed with Covid 19 on the 26th of December outpt, presented to [facility] with mild cough, exertional SOB and fatigue on 10/16/19, her lab work was stable, she had exertional dyspnea and was sent to [facility] for Rx.   Subjective:   Patient in bed appears comfortable, continues to have mild headache mostly right retro-orbital, no shortness of breath, no chest or abdominal pain.  Continues to have diplopia.  Some pain in moving her right eye to the left lateral position.   Assessment  & Plan :      1.   Acute Covid 19 Viral Pneumonitis during the ongoing 2020 Covid 19 Pandemic - she has mild disease and is being treated with IV steroids and remdesivir, no hypoxia or oxygen requirement.  Continue to monitor.  Will start titrating down steroids.  Encouraged the patient to sit up in chair in the daytime use I-S and flutter valve for pulmonary toiletry and then prone in bed when at night.    SpO2: (!) 9 % O2 Flow Rate (L/min): 1 L/min  Recent Labs  Lab 10/16/19 1921 10/18/19 1203 10/19/19 0100 10/19/19 0120 10/20/19 0005 10/21/19 0006  CRP 6.4* 2.2* 2.0*  --  1.5* 1.1*  DDIMER 0.85* 0.94*  --  0.73* 0.51* 0.56*  FERRITIN 679*  --   --   --   --   --   BNP  --  39.1 43.9  --  43.9 35.9  PROCALCITON <0.10 <0.10  --  <0.10 <0.10  --      Hepatic Function Latest Ref Rng & Units 10/21/2019 10/20/2019 10/19/2019  Total Protein 6.5 -  8.1 g/dL 6.6 6.5 6.2(L)  Albumin 3.5 - 5.0 g/dL 3.4(L) 3.2(L) 3.2(L)  AST 15 - 41 U/L 35 36 68(H)  ALT 0 - 44 U/L 102(H) 113(H) 155(H)  Alk Phosphatase 38 - 126 U/L 102 105 123  Total Bilirubin 0.3 - 1.2 mg/dL 0.9 0.4 0.8    2.  Right-sided retro-orbital headache ongoing for 7 to 10 days with diplopia.  CT angiogram head and neck along with MRI brain unremarkable.  Some improvement in headache with migraine cocktail but diplopia continues, question if this is optic neuritis or atypical migraine, neurology on board, currently on aspirin and statin.  Repeating MRI brain with and without IV contrast along with MRI of the orbits on 10/21/2019.  3.  Mild COVID-19 viral illness induced asymptomatic transaminitis.  Trend improving and monitor.   4. 1/2 Staph Lugdunensis Blood culture - contaminant.    Condition - fair  Family Communication  :  husband in detail on 10/19/2019  Code Status :  Full  Diet :   Diet Order            Diet Heart Room service appropriate? Yes; Fluid consistency: Thin  Diet effective now               Disposition Plan  :  Home  Consults  :  Neuro  Procedures  :    MRI brain. Non acute  CT angiogram head and neck - nonacute.  PUD Prophylaxis : None  DVT Prophylaxis  :  Lovenox   Lab Results  Component Value Date   PLT 290 10/21/2019    Inpatient Medications  Scheduled Meds: . aspirin EC  325 mg Oral Daily  . atorvastatin  40 mg Oral q1800  . enoxaparin (LOVENOX) injection  40 mg Subcutaneous Q24H  . methylPREDNISolone (SOLU-MEDROL) injection  20 mg Intravenous Daily   Continuous Infusions: . remdesivir 100 mg in NS 100 mL 100 mg (10/20/19 0931)   PRN Meds:.acetaminophen, albuterol, influenza vac split quadrivalent PF, LORazepam, [DISCONTINUED] ondansetron **OR** ondansetron (ZOFRAN) IV, senna-docusate  Antibiotics  :    Anti-infectives (From  admission, onward)   Start     Dose/Rate Route Frequency Ordered Stop   10/18/19 1000  remdesivir 100 mg in sodium chloride 0.9 % 100 mL IVPB     100 mg 200 mL/hr over 30 Minutes Intravenous Daily 10/17/19 0037 10/22/19 0959   10/18/19 1000  vancomycin (VANCOREADY) IVPB 750 mg/150 mL  Status:  Discontinued     750 mg 150 mL/hr over 60 Minutes Intravenous Every 12 hours 10/18/19 0132 10/18/19 1120   10/18/19 0015  vancomycin (VANCOCIN) IVPB 1000 mg/200 mL premix     1,000 mg 200 mL/hr over 60 Minutes Intravenous  Once 10/18/19 0000 10/18/19 0155   10/17/19 0200  remdesivir 200 mg in sodium chloride 0.9% 250 mL IVPB     200 mg 580 mL/hr over 30 Minutes Intravenous Once 10/17/19 0037 10/17/19 0246       Time Spent in minutes  30   Lala Lund M.D on 10/21/2019 at 9:24 AM  To page go to [email or web address] - password [facility]  [facility] -  Office  [phone]  See all Orders from today for further details    Objective:   Vitals:   10/20/19 1800 10/20/19 2000 10/21/19 0400 10/21/19 0730  BP: 132/80 (!) 150/92 138/84 (!) 143/98  Pulse: 87 77 80 80  Resp:   18 16  Temp: 98.1 F (36.7 C) 98 F (36.7 C) 98.4  F (36.9 C) 99.1 F (37.3 C)  TempSrc: Oral Oral Oral Oral  SpO2: 93% 96% 95% (!) 9%  Weight:      Height:        Wt Readings from Last 3 Encounters:  10/18/19 77.6 kg  07/23/18 74.8 kg  07/20/18 74.9 kg     Intake/Output Summary (Last 24 hours) at 10/21/2019 0924 Last data filed at 10/20/2019 0931 Gross per 24 hour  Intake 560 ml  Output 300 ml  Net 260 ml     Physical Exam  Awake Alert, in no distress, mild diplopia ,  No new F.N deficits    Data Review:    CBC Recent Labs  Lab 10/16/19 1921 10/18/19 1203 10/19/19 0120 10/20/19 0005 10/21/19 0006  WBC 5.8 9.4 7.4 9.2 8.5  HGB 14.5 14.1 12.7 12.4 12.6  HCT 42.7 41.9 37.9 37.2 38.2  PLT 243 292 285 303 290  MCV 89.9 90.5 90.7 90.7 91.2  MCH 30.5 30.5 30.4 30.2 30.1  MCHC 34.0 33.7 33.5  33.3 33.0  RDW 12.4 12.5 12.2 12.2 12.3  LYMPHSABS 1.3  --  1.0 1.6 1.4  MONOABS 0.7  --  0.7 0.9 0.7  EOSABS 0.0  --  0.0 0.0 0.0  BASOSABS 0.0  --  0.0 0.0 0.0    Chemistries  Recent Labs  Lab 10/16/19 1921 10/18/19 1203 10/19/19 0120 10/20/19 0005 10/21/19 0006  NA 140  --  142 139 140  K 3.3*  --  4.0 3.7 3.5  CL 101  --  104 103 101  CO2 27  --  27 26 28   GLUCOSE 109*  --  108* 95 87  BUN 13  --  19 20 22*  CREATININE 0.82 0.59 0.63 0.66 0.71  CALCIUM 8.6*  --  8.6* 8.6* 8.3*  MG  --  2.2 2.1 2.0 2.1  AST 77*  --  68* 36 35  ALT 100*  --  155* 113* 102*  ALKPHOS 142*  --  123 105 102  BILITOT 0.8  --  0.8 0.4 0.9   ------------------------------------------------------------------------------------------------------------------ Recent Labs    10/19/19 0120  CHOL 163  HDL 27*  LDLCALC 110*  TRIG 130  CHOLHDL 6.0    Lab Results  Component Value Date   HGBA1C 5.5 10/19/2019   ------------------------------------------------------------------------------------------------------------------ No results for input(s): TSH, T4TOTAL, T3FREE, THYROIDAB in the last 72 hours.  Invalid input(s): FREET3  Cardiac Enzymes No results for input(s): CKMB, TROPONINI, MYOGLOBIN in the last 168 hours.  Invalid input(s): CK ------------------------------------------------------------------------------------------------------------------    Component Value Date/Time   BNP 35.9 10/21/2019 0006    Micro Results Recent Results (from the past 240 hour(s))  Blood Culture (routine x 2)     Status: Abnormal   Collection Time: 10/16/19  8:27 PM   Specimen: BLOOD  Result Value Ref Range Status   Specimen Description   Final    BLOOD LEFT ANTECUBITAL Performed at [facility], [address] [place], [place] [address]    Special Requests   Final    BOTTLES DRAWN AEROBIC AND ANAEROBIC Blood Culture adequate volume Performed at [facility], [address] [place], [place] [address]    Culture  Setup Time   Final    AEROBIC BOTTLE ONLY GRAM POSITIVE COCCI CRITICAL RESULT CALLED TO, READ BACK BY AND VERIFIED WITH: L ADKINS RN 10/17/19 2143 JDW Performed at [facility], [place] [address], [place], [place] [address]  Culture STAPHYLOCOCCUS LUGDUNENSIS (A)  Final   Report Status 10/19/2019 FINAL  Final   Organism ID, Bacteria STAPHYLOCOCCUS LUGDUNENSIS  Final      Susceptibility   Staphylococcus lugdunensis - MIC*    CIPROFLOXACIN 1 SENSITIVE Sensitive     ERYTHROMYCIN <=0.25 SENSITIVE Sensitive     GENTAMICIN <=0.5 SENSITIVE Sensitive     OXACILLIN 2 SENSITIVE Sensitive     TETRACYCLINE <=1 SENSITIVE Sensitive     VANCOMYCIN <=0.5 SENSITIVE Sensitive     TRIMETH/SULFA <=10 SENSITIVE Sensitive     CLINDAMYCIN <=0.25 SENSITIVE Sensitive     RIFAMPIN <=0.5 SENSITIVE Sensitive     Inducible Clindamycin NEGATIVE Sensitive     * STAPHYLOCOCCUS LUGDUNENSIS  Blood Culture (routine x 2)     Status: None   Collection Time: 10/16/19  8:44 PM   Specimen: BLOOD  Result Value Ref Range Status   Specimen Description   Final    BLOOD RIGHT ANTECUBITAL Performed at [facility], [address] [place], [place] [address]    Special Requests   Final    BOTTLES DRAWN AEROBIC AND ANAEROBIC Blood Culture adequate volume Performed at [facility], [address] [place], [place] [address]    Culture   Final    NO GROWTH 5 DAYS Performed at [facility], [place] [address], [place], [place] [address]    Report Status 10/21/2019 FINAL  Final  SARS Coronavirus 2 Ag (30 min TAT) - Nasal Swab (BD Veritor Kit)     Status: Abnormal   Collection Time: 10/16/19 11:10 PM   Specimen: Nasal Swab (BD Veritor Kit)  Result Value Ref Range Status   SARS Coronavirus 2 Ag POSITIVE (A) NEGATIVE Final    Comment: RESULT CALLED TO, READ BACK BY AND VERIFIED WITH: MAYNARD,C AT 2336 ON [id number] BY CHERESNOWSKY,T (NOTE) SARS-CoV-2  antigen PRESENT. Positive results indicate the presence of viral antigens, but clinical correlation with patient history and other diagnostic information is necessary to determine patient infection status.  Positive results do not rule out bacterial infection or co-infection  with other viruses. False positive results are rare but can occur, and confirmatory RT-PCR testing may be appropriate in some circumstances. The expected result is Negative. Fact Sheet for Patients: PodPark.tn Fact Sheet for Providers: GiftContent.is  This test is not yet approved or cleared by the [place] FDA and  has been authorized for detection and/or diagnosis of SARS-CoV-2 by FDA under an Emergency Use Authorization (EUA).  This EUA will remain in effect (meaning this test can be used) for the duration of  the COVID- 19 declaration under Section 564(b)(1) of the Act, 21 U.S.C. section 360bbb-3(b)(1), unless the authorization is terminated or revoked sooner. Performed at [facility], [address], [place], [place] [address]     Radiology Reports CT ANGIO HEAD W OR WO CONTRAST  Result Date: 10/19/2019 CLINICAL DATA:  Coronavirus infection.  Mental status changes. EXAM: CT ANGIOGRAPHY HEAD AND NECK TECHNIQUE: Multidetector CT imaging of the head and neck was performed using the standard protocol during bolus administration of intravenous contrast. Multiplanar CT image reconstructions and MIPs were obtained to evaluate the vascular anatomy. Carotid stenosis measurements (when applicable) are obtained utilizing NASCET criteria, using the distal internal carotid diameter as the denominator. CONTRAST:  33m OMNIPAQUE IOHEXOL 350 MG/ML SOLN COMPARISON:  None. FINDINGS: CT HEAD FINDINGS Brain: The brain shows a normal appearance without evidence of malformation, atrophy, old or acute small  or large vessel infarction, mass lesion, hemorrhage,  hydrocephalus or extra-axial collection. Vascular: No hyperdense vessel. No evidence of atherosclerotic calcification. Skull: Normal.  No traumatic finding.  No focal bone lesion. Sinuses/Orbits: Sinuses are clear. Orbits appear normal. Mastoids are clear. Other: None significant CTA NECK FINDINGS Aortic arch: Normal Right carotid system: Normal Left carotid system: Normal Vertebral arteries: Normal Skeleton: Normal Other neck: No mass or adenopathy. Upper chest: Patchy bilateral pulmonary infiltrates consistent with the clinical history of viral pneumonia. Review of the MIP images confirms the above findings CTA HEAD FINDINGS Anterior circulation: Both internal carotid arteries are patent through the skull base and siphon regions. No siphon stenosis. The anterior and middle cerebral vessels are patent without stenosis, aneurysm or vascular malformation. No large or medium vessel occlusion. Posterior circulation: Both vertebral arteries are patent through the foramen magnum. The left terminates in PICA. Right vertebral artery supplies the basilar. No basilar stenosis. Posterior circulation branch vessels are normal. Venous sinuses: Patent and normal. Anatomic variants: None significant. Review of the MIP images confirms the above findings IMPRESSION: Normal CT angiography of the head and neck vessels. No evidence of atherosclerotic disease. No large or medium vessel occlusion. Normal appearance of the brain itself. Patchy bilateral pulmonary infiltrates consistent with the clinical history of viral pneumonia. Electronically Signed   By: Nelson Chimes M.D.   On: 10/19/2019 11:48   CT ANGIO NECK W OR WO CONTRAST  Result Date: 10/19/2019 CLINICAL DATA:  Coronavirus infection.  Mental status changes. EXAM: CT ANGIOGRAPHY HEAD AND NECK TECHNIQUE: Multidetector CT imaging of the head and neck was performed using the standard protocol during bolus administration of intravenous contrast. Multiplanar CT image  reconstructions and MIPs were obtained to evaluate the vascular anatomy. Carotid stenosis measurements (when applicable) are obtained utilizing NASCET criteria, using the distal internal carotid diameter as the denominator. CONTRAST:  17m OMNIPAQUE IOHEXOL 350 MG/ML SOLN COMPARISON:  None. FINDINGS: CT HEAD FINDINGS Brain: The brain shows a normal appearance without evidence of malformation, atrophy, old or acute small or large vessel infarction, mass lesion, hemorrhage, hydrocephalus or extra-axial collection. Vascular: No hyperdense vessel. No evidence of atherosclerotic calcification. Skull: Normal.  No traumatic finding.  No focal bone lesion. Sinuses/Orbits: Sinuses are clear. Orbits appear normal. Mastoids are clear. Other: None significant CTA NECK FINDINGS Aortic arch: Normal Right carotid system: Normal Left carotid system: Normal Vertebral arteries: Normal Skeleton: Normal Other neck: No mass or adenopathy. Upper chest: Patchy bilateral pulmonary infiltrates consistent with the clinical history of viral pneumonia. Review of the MIP images confirms the above findings CTA HEAD FINDINGS Anterior circulation: Both internal carotid arteries are patent through the skull base and siphon regions. No siphon stenosis. The anterior and middle cerebral vessels are patent without stenosis, aneurysm or vascular malformation. No large or medium vessel occlusion. Posterior circulation: Both vertebral arteries are patent through the foramen magnum. The left terminates in PICA. Right vertebral artery supplies the basilar. No basilar stenosis. Posterior circulation branch vessels are normal. Venous sinuses: Patent and normal. Anatomic variants: None significant. Review of the MIP images confirms the above findings IMPRESSION: Normal CT angiography of the head and neck vessels. No evidence of atherosclerotic disease. No large or medium vessel occlusion. Normal appearance of the brain itself. Patchy bilateral pulmonary  infiltrates consistent with the clinical history of viral pneumonia. Electronically Signed   By: MNelson ChimesM.D.   On: 10/19/2019 11:48   MR BRAIN WO CONTRAST  Result Date: 10/19/2019 CLINICAL DATA:  New onset  diplopia.  Recent coronavirus infection. EXAM: MRI HEAD WITHOUT CONTRAST TECHNIQUE: Multiplanar, multiecho pulse sequences of the brain and surrounding structures were obtained without intravenous contrast. COMPARISON:  CT angiography same day FINDINGS: Brain: The brain has a normal appearance without evidence of malformation, atrophy, old or acute small or large vessel infarction, mass lesion, hemorrhage, hydrocephalus or extra-axial collection. Vascular: Major vessels at the base of the brain show flow. Venous sinuses appear patent. Skull and upper cervical spine: Normal. Sinuses/Orbits: Clear/normal. Other: None significant. IMPRESSION: Normal examination. No abnormality seen to explain the clinical presentation. No sequela of coronavirus infection identified. Electronically Signed   By: Nelson Chimes M.D.   On: 10/19/2019 12:48   DG Chest Portable 1 View  Result Date: 10/16/2019 CLINICAL DATA:  COVID-19. Increasing shortness of breath. Weakness. Lethargy. EXAM: PORTABLE CHEST 1 VIEW COMPARISON:  11/14/2005 FINDINGS: Midline trachea. Normal heart size. No pleural effusion or pneumothorax. Mild right hemidiaphragm elevation. Mild interstitial opacities are suspected in the periphery of both lung bases. There may also be mild left midlung interstitial opacity. IMPRESSION: Suspicion of subtle lower lung predominant interstitial opacities, as can be seen with mild COVID-19 pneumonia. Electronically Signed   By: Abigail Miyamoto M.D.   On: 10/16/2019 19:38

## 2019-10-22 DIAGNOSIS — R519 Headache, unspecified: Secondary | ICD-10-CM

## 2019-10-22 DIAGNOSIS — R7401 Elevation of levels of liver transaminase levels: Secondary | ICD-10-CM

## 2019-10-22 DIAGNOSIS — J1282 Pneumonia due to coronavirus disease 2019: Secondary | ICD-10-CM

## 2019-10-22 MED ORDER — SENNOSIDES-DOCUSATE SODIUM 8.6-50 MG PO TABS: 1.0000 | ORAL_TABLET | Freq: Every evening | ORAL | 0 refills | Status: AC | PRN

## 2019-10-22 NOTE — Progress Notes (Signed)
Reviewed MRI brain, unremarkable for acute findings. No contrast enhancement seen.

## 2019-10-22 NOTE — Discharge Summary (Signed)
Physician Discharge Summary  Carol Lawson PRF:[id number] DOB: April 12, 1961 DOA: 10/16/2019  PCP: Antony Contras, MD  Admit date: 10/16/2019 Discharge date: 10/22/2019  Admitted From: Home Disposition: Home  Recommendations for Outpatient Follow-up:  1. Follow ups as below. 2. Please ensure referral to ophthalmology 3. Please follow up on the following pending results: None  Home Health: None required Equipment/Devices: None required  Discharge Condition: Stable CODE STATUS: Full code  Follow-up Information    Antony Contras, MD. Schedule an appointment as soon as possible for a visit in 1 week(s).   Specialty: Family Medicine Contact information: [address] [place] [address] [phone]           Hospital Course: 59 year old female with history of benign mesenteric mass, GERD and HTN diagnosed with COVID-19 infection on 10/08/2019 and presented to [facility] ED with mild cough, exertional dyspnea and fatigue on 10/16/2019.  She was admitted to [facility] for acute COVID-19 viral pneumonia and treated with steroid and remdesivir with improvement in her symptoms.   Patient also had right-sided retro-orbital headache ongoing for 7 to 10 days with binocular diplopia.  Diplopia resolves with covering one eye.  Neurology consulted and recommended imaging.  CT angiogram head/neck and MRI brain without contrast without significant finding.  MRI brain and orbit with and without contrast obtained but without significant finding to explain patient's symptoms.  Patient received migraine cocktail with improvement in headache.  Binocular diplopia persisted.  Neurology recommended eye patch as needed and outpatient follow-up with ophthalmology. Ambulatory referral to ophthalmology ordered on discharge. Patient's neuro exam within normal except for binocular diplopia, mainly with far vision.  Patient was counseled on infection prevention and return  precautions.   See individual problem list below for more hospital course.  Discharge Diagnoses:  Acute COVID-19 pneumonia-completed treatment course with remdesivir and steroid.  Saturating well on room air.  Never required oxygen.  Right-sided retro-orbital headache with by nuclear diplopia: Headache improved tremendously.  Imaging including CT head, CTA head/neck, MRI brain and orbits with and without contrast not revealing.  Neurology recommended outpatient ophthalmology follow-up. -Ambulatory referral to ophthalmology ordered. -Patient to wear eye patch as needed.  Mild transaminitis: Likely due to Covid infection on remdesivir.  Improved. -Recheck CMP at follow-up.  Abnormal blood culture: Staph lugdunensis in 1 out of 2 bottles-likely contaminant.  Discharge Instructions  Discharge Instructions    Ambulatory referral to Ophthalmology   Complete by: As directed    Binocular diplopia   Discharge instructions   Complete by: As directed    It has been a pleasure taking care of you! You were hospitalized and treated for COVID-19 infection, double vision and headache.  You have completed treatment for COVID-19 infection.  Your headache improved.  In regards to your double vision, we ordered an ambulatory referral to ophthalmology.  You may also discuss this with your primary care doctor if you don't hear from ophthalmology office next week.  You may wear an eyepatch to help with the double vision until you see your ophthalmologist.  In regards to the Covid infection, you are potentially infectious for the next 5 days. We recommend you isolate yourself and take the necessary precautions to prevent the virus from spreading.  Some of the steps to prevent the virus from spreading to others: Stay away from other and members of your household at least for 10-15 days. Let healthy household members care for children and pets, if possible. If  you have to care for children or pets, wash your  hands often and wear a mask. If possible, stay in your own room, separate from others. Use a different bathroom.Make sure that all people in your household wash their hands well and often. Leave your house only to seek medical care. Do not use public transport. Do not travel while you are sick. Wash your hands often with soap and water for 20 seconds. If soap and water are not available, use alcohol-based hand sanitizer. Cough or sneeze into a tissue or your sleeve or elbow. Do not cough or sneeze into your hand or into the air. Wear a cloth face covering or face mask.  Return precautions: Get help or return to the hospital right away if: You have trouble breathing. You have pain or pressure in your chest. You have confusion. You have bluish lips and fingernails. You have difficulty waking from sleep. You have symptoms that get worse. These symptoms may represent a serious problem that is an emergency. Do not wait to see if the symptoms will go away. Get medical help right away. Call your local emergency services (911 in the [place] Do not drive yourself to the hospital. Let the emergency medical personnel know if you think you have COVID-19.  To protect yourself in the future:  Do not travel to areas where COVID-19 is a risk. The areas where COVID-19 is reported change often. To identify high-risk areas and travel restrictions, check the CDC travel website: FatFares.com.br If you live in, or must travel to, an area where COVID-19 is a risk, take precautions to avoid infection. Stay away from people who are sick. Wash your hands often with soap and water for 20 seconds. If soap and water are not available, use an alcohol-based hand sanitizer. Avoid touching your mouth, face, eyes, or nose. Avoid going out in public, follow guidance from your state and local health authorities. If you must go out in public, wear a cloth face covering or face mask. Disinfect objects and surfaces  that are frequently touched every day. This may include: Counters and tables. Doorknobs and light switches. Sinks and faucets. Electronics, such as phones, remote controls, keyboards, computers, and tablets.    Where to find more information Centers for Disease Control and Prevention: PurpleGadgets.be World Health Organization: https://www.castaneda.info/   Take care,   Increase activity slowly   Complete by: As directed    MyChart COVID-19 home monitoring program   Complete by: Oct 22, 2019    Is the patient willing to use the [facility] for home monitoring?: Yes   Temperature monitoring   Complete by: Oct 22, 2019    After how many days would you like to receive a notification of this patient's flowsheet entries?: 1     Allergies as of 10/22/2019   No Known Allergies     Medication List    TAKE these medications   HYDROcodone-acetaminophen 5-325 MG tablet Commonly known as: NORCO/VICODIN Take 1 tablet by mouth every 6 (six) hours as needed for moderate pain.   ibuprofen 200 MG tablet Commonly known as: ADVIL Take 600 mg by mouth every 6 (six) hours as needed for headache or mild pain.   ondansetron 4 MG tablet Commonly known as: ZOFRAN Take 1 tablet (4 mg total) by mouth every 8 (eight) hours as needed for nausea or vomiting.   OVER THE COUNTER MEDICATION Place 3 drops under the tongue at bedtime as needed (sleep). CBD Oil   promethazine  25 MG tablet Commonly known as: PHENERGAN Take 25 mg by mouth every 6 (six) hours as needed for nausea or vomiting.   senna-docusate 8.6-50 MG tablet Commonly known as: Senokot-S Take 1 tablet by mouth at bedtime as needed for mild constipation.       Consultations:  Neurology  Procedures/Studies:  CT ANGIO HEAD W OR WO CONTRAST  Result Date: 10/19/2019 CLINICAL DATA:  Coronavirus infection.  Mental status changes. EXAM: CT ANGIOGRAPHY HEAD AND NECK TECHNIQUE: Multidetector  CT imaging of the head and neck was performed using the standard protocol during bolus administration of intravenous contrast. Multiplanar CT image reconstructions and MIPs were obtained to evaluate the vascular anatomy. Carotid stenosis measurements (when applicable) are obtained utilizing NASCET criteria, using the distal internal carotid diameter as the denominator. CONTRAST:  50m OMNIPAQUE IOHEXOL 350 MG/ML SOLN COMPARISON:  None. FINDINGS: CT HEAD FINDINGS Brain: The brain shows a normal appearance without evidence of malformation, atrophy, old or acute small or large vessel infarction, mass lesion, hemorrhage, hydrocephalus or extra-axial collection. Vascular: No hyperdense vessel. No evidence of atherosclerotic calcification. Skull: Normal.  No traumatic finding.  No focal bone lesion. Sinuses/Orbits: Sinuses are clear. Orbits appear normal. Mastoids are clear. Other: None significant CTA NECK FINDINGS Aortic arch: Normal Right carotid system: Normal Left carotid system: Normal Vertebral arteries: Normal Skeleton: Normal Other neck: No mass or adenopathy. Upper chest: Patchy bilateral pulmonary infiltrates consistent with the clinical history of viral pneumonia. Review of the MIP images confirms the above findings CTA HEAD FINDINGS Anterior circulation: Both internal carotid arteries are patent through the skull base and siphon regions. No siphon stenosis. The anterior and middle cerebral vessels are patent without stenosis, aneurysm or vascular malformation. No large or medium vessel occlusion. Posterior circulation: Both vertebral arteries are patent through the foramen magnum. The left terminates in PICA. Right vertebral artery supplies the basilar. No basilar stenosis. Posterior circulation branch vessels are normal. Venous sinuses: Patent and normal. Anatomic variants: None significant. Review of the MIP images confirms the above findings IMPRESSION: Normal CT angiography of the head and neck vessels.  No evidence of atherosclerotic disease. No large or medium vessel occlusion. Normal appearance of the brain itself. Patchy bilateral pulmonary infiltrates consistent with the clinical history of viral pneumonia. Electronically Signed   By: MNelson ChimesM.D.   On: 10/19/2019 11:48   CT ANGIO NECK W OR WO CONTRAST  Result Date: 10/19/2019 CLINICAL DATA:  Coronavirus infection.  Mental status changes. EXAM: CT ANGIOGRAPHY HEAD AND NECK TECHNIQUE: Multidetector CT imaging of the head and neck was performed using the standard protocol during bolus administration of intravenous contrast. Multiplanar CT image reconstructions and MIPs were obtained to evaluate the vascular anatomy. Carotid stenosis measurements (when applicable) are obtained utilizing NASCET criteria, using the distal internal carotid diameter as the denominator. CONTRAST:  830mOMNIPAQUE IOHEXOL 350 MG/ML SOLN COMPARISON:  None. FINDINGS: CT HEAD FINDINGS Brain: The brain shows a normal appearance without evidence of malformation, atrophy, old or acute small or large vessel infarction, mass lesion, hemorrhage, hydrocephalus or extra-axial collection. Vascular: No hyperdense vessel. No evidence of atherosclerotic calcification. Skull: Normal.  No traumatic finding.  No focal bone lesion. Sinuses/Orbits: Sinuses are clear. Orbits appear normal. Mastoids are clear. Other: None significant CTA NECK FINDINGS Aortic arch: Normal Right carotid system: Normal Left carotid system: Normal Vertebral arteries: Normal Skeleton: Normal Other neck: No mass or adenopathy. Upper chest: Patchy bilateral pulmonary infiltrates consistent with the clinical history of viral pneumonia. Review of  the MIP images confirms the above findings CTA HEAD FINDINGS Anterior circulation: Both internal carotid arteries are patent through the skull base and siphon regions. No siphon stenosis. The anterior and middle cerebral vessels are patent without stenosis, aneurysm or vascular  malformation. No large or medium vessel occlusion. Posterior circulation: Both vertebral arteries are patent through the foramen magnum. The left terminates in PICA. Right vertebral artery supplies the basilar. No basilar stenosis. Posterior circulation branch vessels are normal. Venous sinuses: Patent and normal. Anatomic variants: None significant. Review of the MIP images confirms the above findings IMPRESSION: Normal CT angiography of the head and neck vessels. No evidence of atherosclerotic disease. No large or medium vessel occlusion. Normal appearance of the brain itself. Patchy bilateral pulmonary infiltrates consistent with the clinical history of viral pneumonia. Electronically Signed   By: Nelson Chimes M.D.   On: 10/19/2019 11:48   MR BRAIN WO CONTRAST  Result Date: 10/19/2019 CLINICAL DATA:  New onset diplopia.  Recent coronavirus infection. EXAM: MRI HEAD WITHOUT CONTRAST TECHNIQUE: Multiplanar, multiecho pulse sequences of the brain and surrounding structures were obtained without intravenous contrast. COMPARISON:  CT angiography same day FINDINGS: Brain: The brain has a normal appearance without evidence of malformation, atrophy, old or acute small or large vessel infarction, mass lesion, hemorrhage, hydrocephalus or extra-axial collection. Vascular: Major vessels at the base of the brain show flow. Venous sinuses appear patent. Skull and upper cervical spine: Normal. Sinuses/Orbits: Clear/normal. Other: None significant. IMPRESSION: Normal examination. No abnormality seen to explain the clinical presentation. No sequela of coronavirus infection identified. Electronically Signed   By: Nelson Chimes M.D.   On: 10/19/2019 12:48   MR BRAIN W CONTRAST  Result Date: 10/21/2019 CLINICAL DATA:  Diplopia, recent coronavirus infection EXAM: MRI HEAD AND ORBITS WITHOUT AND WITH CONTRAST TECHNIQUE: Multiplanar, multiecho pulse sequences of the brain and surrounding structures were obtained with intravenous  contrast. Multiplanar, multiecho pulse sequences of the orbits and surrounding structures were obtained including fat saturation techniques, before and after intravenous contrast administration. CONTRAST:  7.69m GADAVIST GADOBUTROL 1 MMOL/ML IV SOLN COMPARISON:  10/19/2019 FINDINGS: MRI HEAD FINDINGS Brain: There is no abnormal enhancement. Vascular: Unremarkable. Skull and upper cervical spine: Unremarkable. Other: None. MRI ORBITS FINDINGS Orbits: There is no proptosis. There is no intraorbital mass. Globes, extraocular muscles, and lacrimal glands are symmetric and unremarkable. There is no abnormal enhancement of the optic nerve sheath complexes. Visualized sinuses: Ethmoid mucosal thickening. Soft tissues: Unremarkable. IMPRESSION: No abnormal enhancement.  No intraorbital mass. Electronically Signed   By: PMacy MisM.D.   On: 10/21/2019 21:34   DG Chest Portable 1 View  Result Date: 10/16/2019 CLINICAL DATA:  COVID-19. Increasing shortness of breath. Weakness. Lethargy. EXAM: PORTABLE CHEST 1 VIEW COMPARISON:  11/14/2005 FINDINGS: Midline trachea. Normal heart size. No pleural effusion or pneumothorax. Mild right hemidiaphragm elevation. Mild interstitial opacities are suspected in the periphery of both lung bases. There may also be mild left midlung interstitial opacity. IMPRESSION: Suspicion of subtle lower lung predominant interstitial opacities, as can be seen with mild COVID-19 pneumonia. Electronically Signed   By: KAbigail MiyamotoM.D.   On: 10/16/2019 19:38   MR ORBITS W WO CONTRAST  Result Date: 10/21/2019 CLINICAL DATA:  Diplopia, recent coronavirus infection EXAM: MRI HEAD AND ORBITS WITHOUT AND WITH CONTRAST TECHNIQUE: Multiplanar, multiecho pulse sequences of the brain and surrounding structures were obtained with intravenous contrast. Multiplanar, multiecho pulse sequences of the orbits and surrounding structures were obtained including fat saturation techniques, before  and after  intravenous contrast administration. CONTRAST:  7.60m GADAVIST GADOBUTROL 1 MMOL/ML IV SOLN COMPARISON:  10/19/2019 FINDINGS: MRI HEAD FINDINGS Brain: There is no abnormal enhancement. Vascular: Unremarkable. Skull and upper cervical spine: Unremarkable. Other: None. MRI ORBITS FINDINGS Orbits: There is no proptosis. There is no intraorbital mass. Globes, extraocular muscles, and lacrimal glands are symmetric and unremarkable. There is no abnormal enhancement of the optic nerve sheath complexes. Visualized sinuses: Ethmoid mucosal thickening. Soft tissues: Unremarkable. IMPRESSION: No abnormal enhancement.  No intraorbital mass. Electronically Signed   By: PMacy MisM.D.   On: 10/21/2019 21:34      Discharge Exam: Vitals:   10/22/19 0548 10/22/19 0958  BP: 122/74   Pulse: 80   Resp: 18   Temp: 98.1 F (36.7 C)   SpO2: 92% 96%    GENERAL: No acute distress.  Appears well.  HEENT: MMM.  Vision and hearing grossly intact.  Binocular diplopia NECK: Supple.  No apparent JVD.  RESP:  No IWOB. Good air movement bilaterally. CVS:  RRR. Heart sounds normal.  ABD/GI/GU: Bowel sounds present. Soft. Non tender.  MSK/EXT:  Moves extremities. No apparent deformity or edema.  SKIN: no apparent skin lesion or wound NEURO: Awake, alert and oriented appropriately.  Neuro exam within normal except for binocular diplopia mainly with far vision.  Able to read fine prints from both eyes. PSYCH: Calm. Normal affect.   The results of significant diagnostics from this hospitalization (including imaging, microbiology, ancillary and laboratory) are listed below for reference.     Microbiology: Recent Results (from the past 240 hour(s))  Blood Culture (routine x 2)     Status: Abnormal   Collection Time: 10/16/19  8:27 PM   Specimen: BLOOD  Result Value Ref Range Status   Specimen Description   Final    BLOOD LEFT ANTECUBITAL [facility] [address], [place] [address]   Special Requests   Final    BOTTLES DRAWN AEROBIC AND ANAEROBIC Blood Culture adequate volume [facility]formed at [facility] [address], [id number]   Culture  Setup Time   Final    AEROBIC BOTTLE ONLY GRAM POSITIVE COCCI CRITICAL RESULT CALLED TO, READ BACK BY AND VERIFIED WITH: L ADKINS RN 10/17/19 2143 JDW [facility]formed at [facility] [address], [place] [address]   Culture STAPHYLOCOCCUS LUGDUNENSIS (A)  Final   Report Status 10/19/2019 FINAL  Final   Organism ID, Bacteria STAPHYLOCOCCUS LUGDUNENSIS  Final      Susceptibility   Staphylococcus lugdunensis - MIC*    CIPROFLOXACIN 1 SENSITIVE Sensitive     ERYTHROMYCIN <=0.25 SENSITIVE Sensitive     GENTAMICIN <=0.5 SENSITIVE Sensitive     OXACILLIN 2 SENSITIVE Sensitive     TETRACYCLINE <=1 SENSITIVE Sensitive     VANCOMYCIN <=0.5 SENSITIVE Sensitive     TRIMETH/SULFA <=10 SENSITIVE Sensitive     CLINDAMYCIN <=0.25 SENSITIVE Sensitive     RIFAMPIN <=0.5 SENSITIVE Sensitive     Inducible Clindamycin NEGATIVE Sensitive     * STAPHYLOCOCCUS LUGDUNENSIS  Blood Culture (routine x 2)     Status: None   Collection Time: 10/16/19  8:44 PM   Specimen: BLOOD  Result Value Ref Range Status   Specimen Description   Final    BLOOD RIGHT ANTECUBITAL [facility] at [facility] [address], [place]  [id number]   Special Requests  Final    BOTTLES DRAWN AEROBIC AND ANAEROBIC Blood Culture adequate volume Performed at [facility], [address] [place], [place] [address]    Culture   Final    NO GROWTH 5 DAYS Performed at [facility], [place] [address] [place], [place] [address]    Report Status 10/21/2019 FINAL  Final  SARS Coronavirus 2 Ag (30 min TAT) - Nasal Swab (BD Veritor Kit)     Status: Abnormal   Collection Time: 10/16/19 11:10 PM   Specimen: Nasal Swab (BD Veritor Kit)  Result Value Ref Range Status   SARS Coronavirus 2 Ag POSITIVE  (A) NEGATIVE Final    Comment: RESULT CALLED TO, READ BACK BY AND VERIFIED WITH: MAYNARD,C AT 2336 ON [id number] BY CHERESNOWSKY,T (NOTE) SARS-CoV-2 antigen PRESENT. Positive results indicate the presence of viral antigens, but clinical correlation with patient history and other diagnostic information is necessary to determine patient infection status.  Positive results do not rule out bacterial infection or co-infection  with other viruses. False positive results are rare but can occur, and confirmatory RT-PCR testing may be appropriate in some circumstances. The expected result is Negative. Fact Sheet for Patients: PodPark.tn Fact Sheet for Providers: GiftContent.is  This test is not yet approved or cleared by the [place] FDA and  has been authorized for detection and/or diagnosis of SARS-CoV-2 by FDA under an Emergency Use Authorization (EUA).  This EUA will remain in effect (meaning this test can be used) for the duration of  the COVID- 19 declaration under Section 564(b)(1) of the Act, 21 U.S.C. section 360bbb-3(b)(1), unless the authorization is terminated or revoked sooner. Performed at [facility], [address] [place], [place] [address]      Labs: BNP (last 3 results) Recent Labs    10/19/19 0100 10/20/19 0005 10/21/19 0006  BNP 43.9 43.9 16.3   Basic Metabolic Panel: Recent Labs  Lab 10/16/19 1921 10/18/19 1203 10/19/19 0120 10/20/19 0005 10/21/19 0006  NA 140  --  142 139 140  K 3.3*  --  4.0 3.7 3.5  CL 101  --  104 103 101  CO2 27  --  27 26 28   GLUCOSE 109*  --  108* 95 87  BUN 13  --  19 20 22*  CREATININE 0.82 0.59 0.63 0.66 0.71  CALCIUM 8.6*  --  8.6* 8.6* 8.3*  MG  --  2.2 2.1 2.0 2.1   Liver Function Tests: Recent Labs  Lab 10/16/19 1921 10/19/19 0120 10/20/19 0005 10/21/19 0006  AST 77* 68* 36 35  ALT 100* 155* 113* 102*  ALKPHOS 142* 123 105 102  BILITOT 0.8  0.8 0.4 0.9  PROT 7.7 6.2* 6.5 6.6  ALBUMIN 3.7 3.2* 3.2* 3.4*   No results for input(s): LIPASE, AMYLASE in the last 168 hours. No results for input(s): AMMONIA in the last 168 hours. CBC: Recent Labs  Lab 10/16/19 1921 10/18/19 1203 10/19/19 0120 10/20/19 0005 10/21/19 0006  WBC 5.8 9.4 7.4 9.2 8.5  NEUTROABS 3.6  --  5.7 6.5 6.2  HGB 14.5 14.1 12.7 12.4 12.6  HCT 42.7 41.9 37.9 37.2 38.2  MCV 89.9 90.5 90.7 90.7 91.2  PLT 243 292 285 303 290   Cardiac Enzymes: No results for input(s): CKTOTAL, CKMB, CKMBINDEX, TROPONINI in the last 168 hours. BNP: Invalid input(s): POCBNP CBG: No results for input(s): GLUCAP in the last 168 hours. D-Dimer Recent Labs    10/20/19 0005 10/21/19  0006  DDIMER 0.51* 0.56*   Hgb A1c No results for input(s): HGBA1C in the last 72 hours. Lipid Profile No results for input(s): CHOL, HDL, LDLCALC, TRIG, CHOLHDL, LDLDIRECT in the last 72 hours. Thyroid function studies No results for input(s): TSH, T4TOTAL, T3FREE, THYROIDAB in the last 72 hours.  Invalid input(s): FREET3 Anemia work up No results for input(s): VITAMINB12, FOLATE, FERRITIN, TIBC, IRON, RETICCTPCT in the last 72 hours. Urinalysis    Component Value Date/Time   COLORURINE YELLOW 06/24/2018 [place] 06/24/2018 0555   LABSPEC 1.025 06/24/2018 0555   PHURINE 6.0 06/24/2018 0555   GLUCOSEU NEGATIVE 06/24/2018 0555   HGBUR MODERATE (A) 06/24/2018 0555   BILIRUBINUR NEGATIVE 06/24/2018 0555   KETONESUR 40 (A) 06/24/2018 0555   PROTEINUR NEGATIVE 06/24/2018 0555   NITRITE NEGATIVE 06/24/2018 0555   LEUKOCYTESUR NEGATIVE 06/24/2018 0555   Sepsis Labs Invalid input(s): PROCALCITONIN,  WBC,  LACTICIDVEN   Time coordinating discharge: 35 minutes  SIGNED:  Mercy Riding, MD  [facility] 10/22/2019, 11:34 AM  If 7PM-7AM, please contact night-coverage [email or web address] Password [email or web address]

## 2019-10-22 NOTE — Progress Notes (Signed)
AVS given to patient and explained at the bedside. Medications and follow up appointments have been explained with pt verbalizing understanding.  

## 2019-10-23 ENCOUNTER — Encounter (INDEPENDENT_AMBULATORY_CARE_PROVIDER_SITE_OTHER): Payer: Self-pay

## 2019-10-24 ENCOUNTER — Encounter (INDEPENDENT_AMBULATORY_CARE_PROVIDER_SITE_OTHER): Payer: Self-pay

## 2019-10-25 ENCOUNTER — Encounter (INDEPENDENT_AMBULATORY_CARE_PROVIDER_SITE_OTHER): Payer: Self-pay

## 2019-10-26 ENCOUNTER — Encounter (INDEPENDENT_AMBULATORY_CARE_PROVIDER_SITE_OTHER): Payer: Self-pay

## 2019-10-27 ENCOUNTER — Encounter (INDEPENDENT_AMBULATORY_CARE_PROVIDER_SITE_OTHER): Payer: Self-pay

## 2019-10-29 ENCOUNTER — Encounter (INDEPENDENT_AMBULATORY_CARE_PROVIDER_SITE_OTHER): Payer: Self-pay

## 2019-10-30 ENCOUNTER — Encounter (INDEPENDENT_AMBULATORY_CARE_PROVIDER_SITE_OTHER): Payer: Self-pay

## 2019-10-31 ENCOUNTER — Encounter (INDEPENDENT_AMBULATORY_CARE_PROVIDER_SITE_OTHER): Payer: Self-pay

## 2019-11-01 ENCOUNTER — Encounter (INDEPENDENT_AMBULATORY_CARE_PROVIDER_SITE_OTHER): Payer: Self-pay

## 2020-11-11 IMAGING — MR MR HEAD W/O CM
9 of 10 series · 37 of 48 positions shown · non-contrast
Comparison: CT angiography same day

CLINICAL DATA: New onset diplopia.  Recent coronavirus infection.

EXAM:
MRI HEAD WITHOUT CONTRAST
TECHNIQUE: Multiplanar, multiecho pulse sequences of the brain and surrounding
structures were obtained without intravenous contrast.

[Series 3: DWI · axial · 3.0mm · 1.09mm/px · z∈[-18,+122]mm · 9 of 96 slices shown (1 of 4)]
[im 1/96]
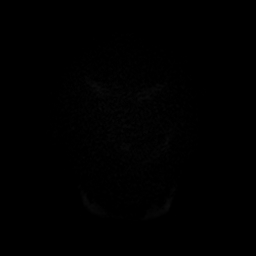
[im 12/96]
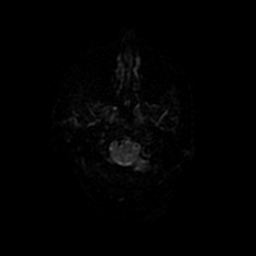
[im 24/96]
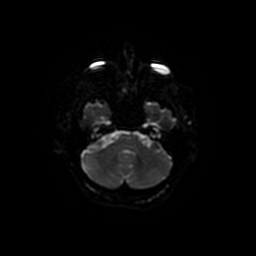
[im 36/96]
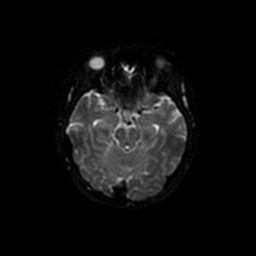
[im 48/96]
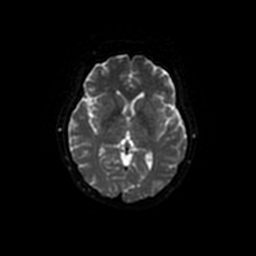
[im 60/96]
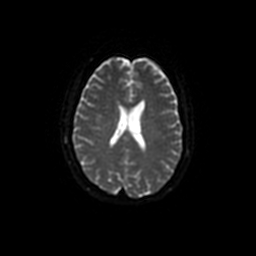
[im 72/96]
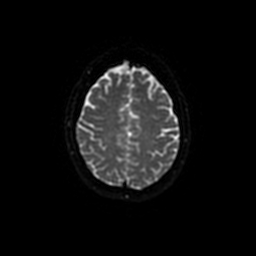
[im 84/96]
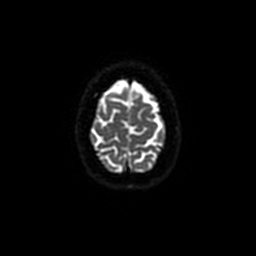
[im 96/96]
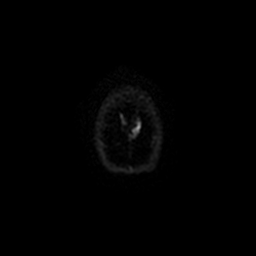

[Series 4: T1 · sagittal · 5.0mm · 0.47mm/px · 2 of 22 slices shown (1 of 2)]
[im 1/22]
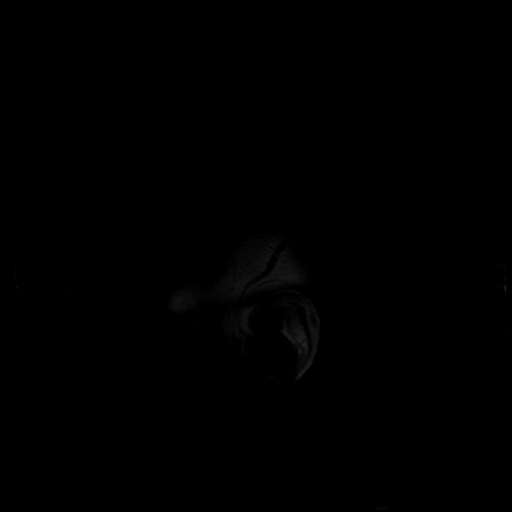
[im 22/22]
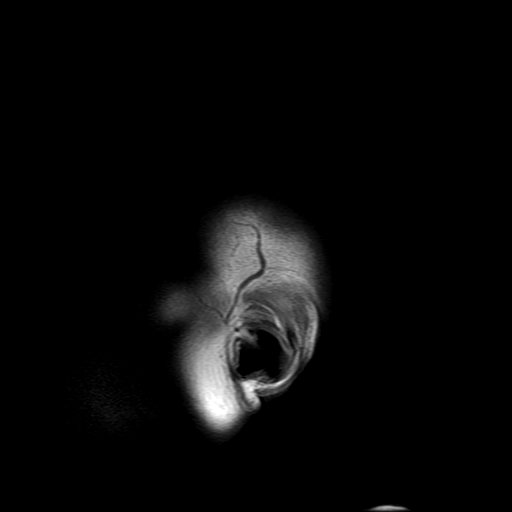

[Series 5: T2 · axial · 5.0mm · 0.43mm/px · z∈[-37,+113]mm · 3 of 26 slices shown (1 of 2)]
[im 1/26]
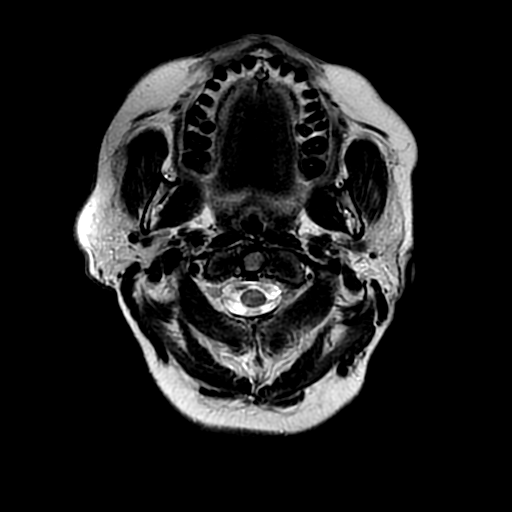
[im 13/26]
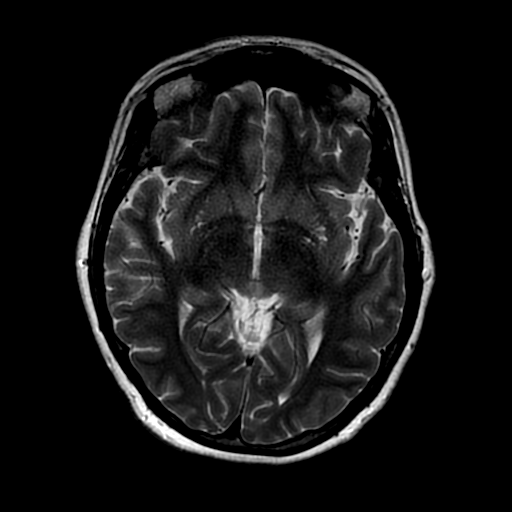
[im 26/26]
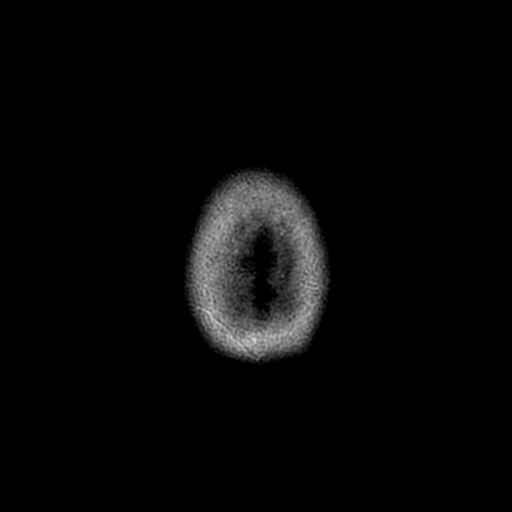

[Series 6: FLAIR · axial · 3.0mm · 0.43mm/px · z∈[-34,+115]mm · 3 of 26 slices shown]
[im 1/26]
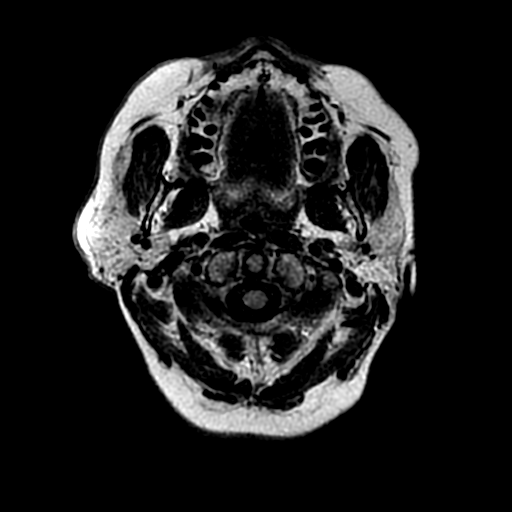
[im 13/26]
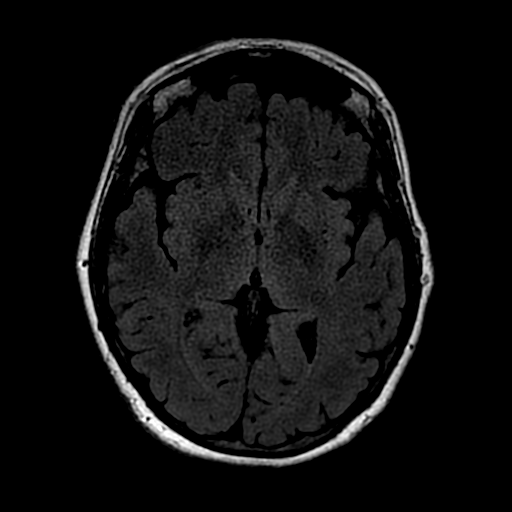
[im 26/26]
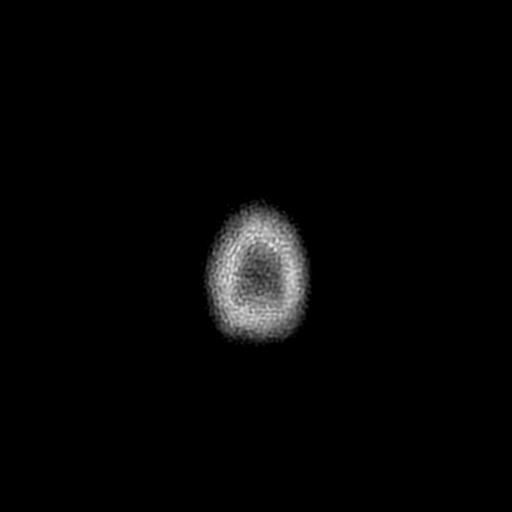

[Series 8: T1 · axial · 1.8mm · 0.47mm/px · 1 of 86 slices shown (2 of 2)]
[im 1/86]
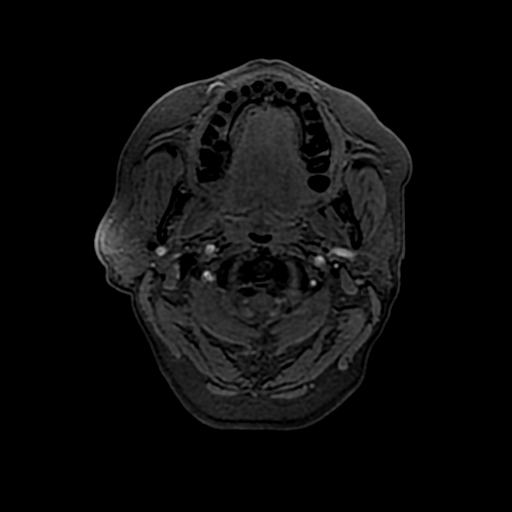

[Series 9: DWI · coronal · 4.0mm · 1.09mm/px · 8 of 78 slices shown (2 of 4)]
[im 1/78]
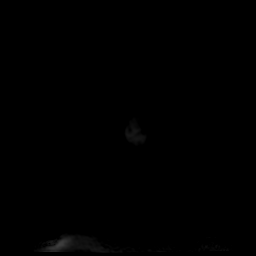
[im 12/78]
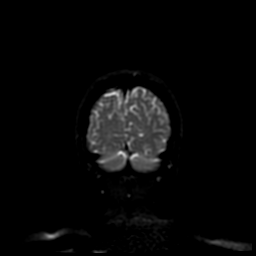
[im 23/78]
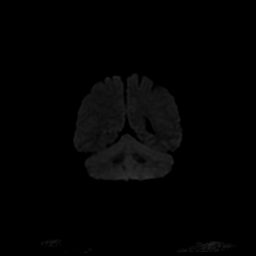
[im 34/78]
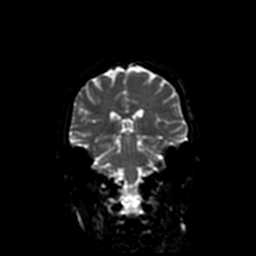
[im 45/78]
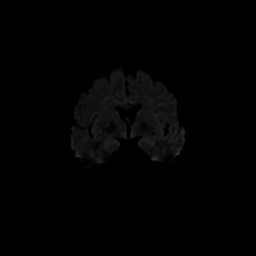
[im 56/78]
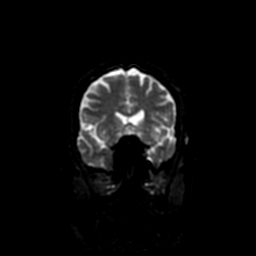
[im 67/78]
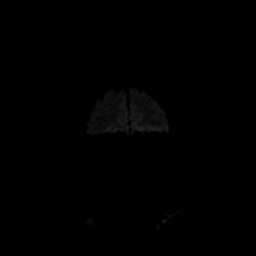
[im 78/78]
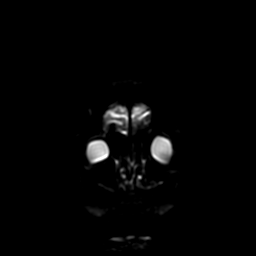

[Series 10: T2 · coronal · 5.0mm · 0.47mm/px · 2 of 24 slices shown (2 of 2)]
[im 1/24]
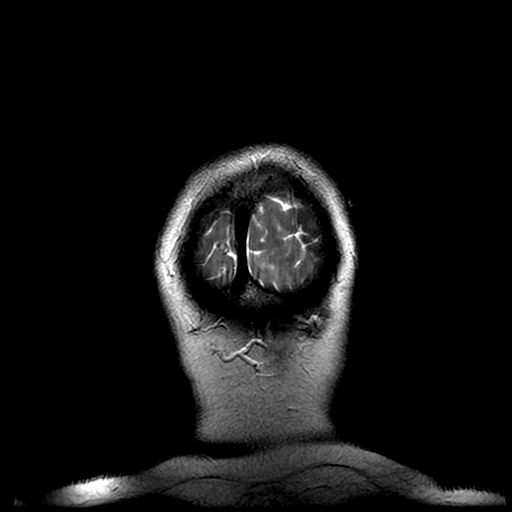
[im 24/24]
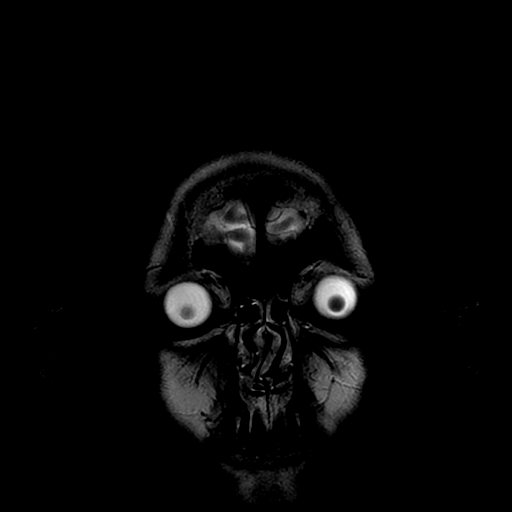

[Series 300: DWI · axial · 3.0mm · 1.09mm/px · z∈[-18,+122]mm · 5 of 48 slices shown (3 of 4)]
[im 1/48]
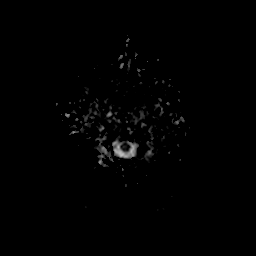
[im 12/48]
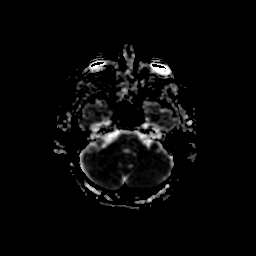
[im 24/48]
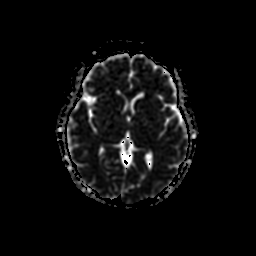
[im 36/48]
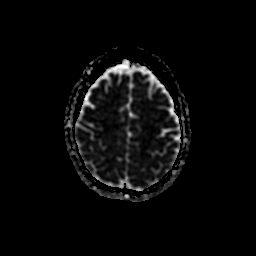
[im 48/48]
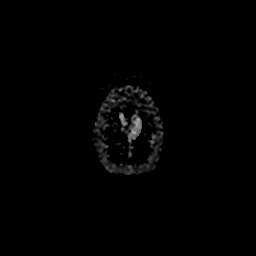

[Series 900: DWI · coronal · 4.0mm · 1.09mm/px · 4 of 39 slices shown (4 of 4)]
[im 1/39]
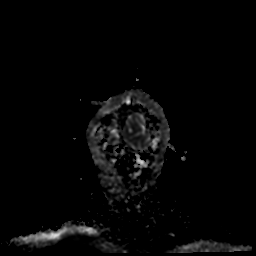
[im 13/39]
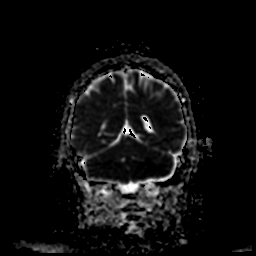
[im 26/39]
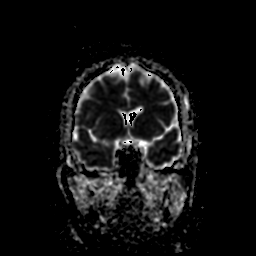
[im 39/39]
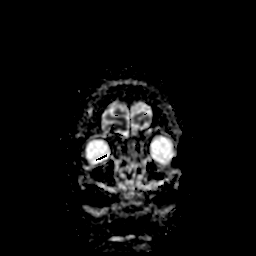

[37 of 48 positions shown; findings below may reference images not displayed]

FINDINGS: Brain: The brain has a normal appearance without evidence of
malformation, atrophy, old or acute small or large vessel
infarction, mass lesion, hemorrhage, hydrocephalus or extra-axial
collection.

Vascular: Major vessels at the base of the brain show flow. Venous
sinuses appear patent.

Skull and upper cervical spine: Normal.

Sinuses/Orbits: Clear/normal.

Other: None significant.
IMPRESSION: Normal examination. No abnormality seen to explain the clinical
presentation. No sequela of coronavirus infection identified.

## 2022-03-06 ENCOUNTER — Ambulatory Visit (INDEPENDENT_AMBULATORY_CARE_PROVIDER_SITE_OTHER): Payer: No Typology Code available for payment source | Admitting: Cardiology

## 2022-03-06 ENCOUNTER — Telehealth: Payer: Self-pay | Admitting: *Deleted

## 2022-03-06 ENCOUNTER — Encounter: Payer: Self-pay | Admitting: Cardiology

## 2022-03-06 VITALS — BP 136/82 | HR 74 | Ht 63.0 in | Wt 163.0 lb

## 2022-03-06 DIAGNOSIS — R072 Precordial pain: Secondary | ICD-10-CM | POA: Diagnosis not present

## 2022-03-06 DIAGNOSIS — I10 Essential (primary) hypertension: Secondary | ICD-10-CM | POA: Diagnosis not present

## 2022-03-06 DIAGNOSIS — R079 Chest pain, unspecified: Secondary | ICD-10-CM

## 2022-03-06 DIAGNOSIS — R531 Weakness: Secondary | ICD-10-CM | POA: Diagnosis not present

## 2022-03-06 LAB — BASIC METABOLIC PANEL
BUN/Creatinine Ratio: 13 (ref 9–23)
BUN: 10 mg/dL (ref 6–24)
CO2: 26 mmol/L (ref 20–29)
Calcium: 9.2 mg/dL (ref 8.7–10.2)
Chloride: 102 mmol/L (ref 96–106)
Creatinine, Ser: 0.76 mg/dL (ref 0.57–1.00)
Glucose: 98 mg/dL (ref 70–99)
Potassium: 4.1 mmol/L (ref 3.5–5.2)
Sodium: 138 mmol/L (ref 134–144)
eGFR: 91 mL/min/{1.73_m2} (ref >59)

## 2022-03-06 MED ORDER — METOPROLOL TARTRATE 100 MG PO TABS: 100.0000 mg | ORAL_TABLET | Freq: Once | ORAL | 0 refills | Status: AC

## 2022-03-06 NOTE — Telephone Encounter (Signed)
Pt was seen in the office today with Dr. Radford Pax who ordered an Itamar sleep study. Pt has uploaded application onto her phone. I have reviewed the instructional tutorial with the pt. I went over to not open the box until she has been called with a PIN #. Pt agreeable to waiver that she signed.

## 2022-03-06 NOTE — Patient Instructions (Addendum)
Medication Instructions:  Your physician recommends that you continue on your current medications as directed. Please refer to the Current Medication list given to you today.  *If you need a refill on your cardiac medications before your next appointment, please call your pharmacy*   Lab Work: TODAY: BMET If you have labs (blood work) drawn today and your tests are completely normal, you will receive your results only by: [place] (if you have MyChart) OR A paper copy in the mail If you have any lab test that is abnormal or we need to change your treatment, we will call you to review the results.   Testing/Procedures: Your physician has requested that you have an echocardiogram. Echocardiography is a painless test that uses sound waves to create images of your heart. It provides your doctor with information about the size and shape of your heart and how well your heart's chambers and valves are working. This procedure takes approximately one hour. There are no restrictions for this procedure.  Your physician has requested that you have a coronary CTA scan. Please see below for further instructions.   Your physician has requested that you wear a 24 hour blood pressure monitor. Our techs will be in touch with you to get this set up.    Follow-Up: At [facility], you and your health needs are our priority.  As part of our continuing mission to provide you with exceptional heart care, we have created designated Provider Care Teams.  These Care Teams include your primary Cardiologist (physician) and Advanced Practice Providers (APPs -  Physician Assistants and Nurse Practitioners) who all work together to provide you with the care you need, when you need it.  Your next appointment:   1 year(s)  The format for your next appointment:   In Person  Provider:   Fransico Him, MD     Other Instructions   Your cardiac CT will be scheduled at:   [facility] [address] [place], [place] [address] [phone]  If scheduled at [facility], please arrive at the [facility] and Children's Entrance (Entrance C2) of [facility] 30 minutes prior to test start time. You can use the FREE valet parking offered at entrance C (encouraged to control the heart rate for the test)  Proceed to the [facility] Radiology Department (first floor) to check-in and test prep.  All radiology patients and guests should use entrance C2 at [facility], accessed from [facility], even though the hospital's physical address listed is [address]     Please follow these instructions carefully (unless otherwise directed):  On the Night Before the Test: Be sure to Drink plenty of water. Do not consume any caffeinated/decaffeinated beverages or chocolate 12 hours prior to your test. Do not take any antihistamines 12 hours prior to your test.  On the Day of the Test: Drink plenty of water until 1 hour prior to the test. Do not eat any food 4 hours prior to the test. You may take your regular medications prior to the test.  Take metoprolol (Lopressor) two hours prior to test. FEMALES- please wear underwire-free bra if available, avoid dresses & tight clothing  After the Test: Drink plenty of water. After receiving IV contrast, you may experience a mild flushed feeling. This is normal. On occasion, you may experience a mild rash up to 24 hours after the test. This is not dangerous. If this occurs, you can take Benadryl  25 mg and increase your fluid intake. If you experience trouble breathing, this can be serious. If it is severe call 911 IMMEDIATELY. If it is mild, please call our office. If you take any of these medications: Glipizide/Metformin, Avandament, Glucavance, please do not take 48 hours after completing test unless otherwise instructed.  We will call to schedule your test 2-4 weeks out understanding that some insurance  companies will need an authorization prior to the service being performed.   For non-scheduling related questions, please contact the cardiac imaging nurse navigator should you have any questions/concerns: Marchia Bond, Cardiac Imaging Nurse Navigator Gordy Clement, Cardiac Imaging Nurse Navigator [facility] Direct Office Dial: [phone]   For scheduling needs, including cancellations and rescheduling, please call [place], [phone].    Low-Sodium Eating Plan Sodium, which is an element that makes up salt, helps you maintain a healthy balance of fluids in your body. Too much sodium can increase your blood pressure and cause fluid and waste to be held in your body. Your health care provider or dietitian may recommend following this plan if you have high blood pressure (hypertension), kidney disease, liver disease, or heart failure. Eating less sodium can help lower your blood pressure, reduce swelling, and protect your heart, liver, and kidneys. What are tips for following this plan? Reading food labels The Nutrition Facts label lists the amount of sodium in one serving of the food. If you eat more than one serving, you must multiply the listed amount of sodium by the number of servings. Choose foods with less than 140 mg of sodium per serving. Avoid foods with 300 mg of sodium or more per serving. Shopping  Look for lower-sodium products, often labeled as "low-sodium" or "no salt added." Always check the sodium content, even if foods are labeled as "unsalted" or "no salt added." Buy fresh foods. Avoid canned foods and pre-made or frozen meals. Avoid canned, cured, or processed meats. Buy breads that have less than 80 mg of sodium per slice. Cooking  Eat more home-cooked food and less restaurant, buffet, and fast food. Avoid adding salt when cooking. Use salt-free seasonings or herbs instead of table salt or sea salt. Check with your health care  provider or pharmacist before using salt substitutes. Cook with plant-based oils, such as canola, sunflower, or olive oil. Meal planning When eating at a restaurant, ask that your food be prepared with less salt or no salt, if possible. Avoid dishes labeled as brined, pickled, cured, smoked, or made with soy sauce, miso, or teriyaki sauce. Avoid foods that contain MSG (monosodium glutamate). MSG is sometimes added to [place] food, bouillon, and some canned foods. Make meals that can be grilled, baked, poached, roasted, or steamed. These are generally made with less sodium. General information Most people on this plan should limit their sodium intake to 1,500-2,000 mg (milligrams) of sodium each day. What foods should I eat? Fruits Fresh, frozen, or canned fruit. Fruit juice. Vegetables Fresh or frozen vegetables. "No salt added" canned vegetables. "No salt added" tomato sauce and paste. Low-sodium or reduced-sodium tomato and vegetable juice. Grains Low-sodium cereals, including oats, puffed wheat and rice, and shredded wheat. Low-sodium crackers. Unsalted rice. Unsalted pasta. Low-sodium bread. Whole-grain breads and whole-grain pasta. Meats and other proteins Fresh or frozen (no salt added) meat, poultry, seafood, and fish. Low-sodium canned tuna and salmon. Unsalted nuts. Dried peas, beans, and lentils without added salt. Unsalted canned beans. Eggs. Unsalted nut butters. Dairy Milk. Soy milk. Cheese  that is naturally low in sodium, such as ricotta cheese, fresh mozzarella, or Swiss cheese. Low-sodium or reduced-sodium cheese. Cream cheese. Yogurt. Seasonings and condiments Fresh and dried herbs and spices. Salt-free seasonings. Low-sodium mustard and ketchup. Sodium-free salad dressing. Sodium-free light mayonnaise. Fresh or refrigerated horseradish. Lemon juice. Vinegar. Other foods Homemade, reduced-sodium, or low-sodium soups. Unsalted popcorn and pretzels. Low-salt or salt-free  chips. The items listed above may not be a complete list of foods and beverages you can eat. Contact a dietitian for more information. What foods should I avoid? Vegetables Sauerkraut, pickled vegetables, and relishes. Olives. [place] fries. Onion rings. Regular canned vegetables (not low-sodium or reduced-sodium). Regular canned tomato sauce and paste (not low-sodium or reduced-sodium). Regular tomato and vegetable juice (not low-sodium or reduced-sodium). Frozen vegetables in sauces. Grains Instant hot cereals. Bread stuffing, pancake, and biscuit mixes. Croutons. Seasoned rice or pasta mixes. Noodle soup cups. Boxed or frozen macaroni and cheese. Regular salted crackers. Self-rising flour. Meats and other proteins Meat or fish that is salted, canned, smoked, spiced, or pickled. Precooked or cured meat, such as sausages or meat loaves. Berniece Salines. Ham. Pepperoni. Hot dogs. Corned beef. Chipped beef. Salt pork. Jerky. Pickled herring. Anchovies and sardines. Regular canned tuna. Salted nuts. Dairy Processed cheese and cheese spreads. Hard cheeses. Cheese curds. Blue cheese. Feta cheese. String cheese. Regular cottage cheese. Buttermilk. Canned milk. Fats and oils Salted butter. Regular margarine. Ghee. Bacon fat. Seasonings and condiments Onion salt, garlic salt, seasoned salt, table salt, and sea salt. Canned and packaged gravies. Worcestershire sauce. Tartar sauce. Barbecue sauce. Teriyaki sauce. Soy sauce, including reduced-sodium. Steak sauce. Fish sauce. Oyster sauce. Cocktail sauce. Horseradish that you find on the shelf. Regular ketchup and mustard. Meat flavorings and tenderizers. Bouillon cubes. Hot sauce. Pre-made or packaged marinades. Pre-made or packaged taco seasonings. Relishes. Regular salad dressings. Salsa. Other foods Salted popcorn and pretzels. Corn chips and puffs. Potato and tortilla chips. Canned or dried soups. Pizza. Frozen entrees and pot pies. The items listed above may not be  a complete list of foods and beverages you should avoid. Contact a dietitian for more information. Summary Eating less sodium can help lower your blood pressure, reduce swelling, and protect your heart, liver, and kidneys. Most people on this plan should limit their sodium intake to 1,500-2,000 mg (milligrams) of sodium each day. Canned, boxed, and frozen foods are high in sodium. Restaurant foods, fast foods, and pizza are also very high in sodium. You also get sodium by adding salt to food. Try to cook at home, eat more fresh fruits and vegetables, and eat less fast food and canned, processed, or prepared foods. This information is not intended to replace advice given to you by your health care provider. Make sure you discuss any questions you have with your health care provider. Document Revised: 11/04/2019 Document Reviewed: 08/31/2019 Elsevier Patient Education  [place]

## 2022-03-06 NOTE — Progress Notes (Addendum)
Cardiology CONSULT Note    Date:  03/06/2022   ID:  Carol Lawson, DOB [birth year], MRN [id number]  PCP:  Antony Contras, MD  Cardiologist:  Fransico Him, MD   Chief Complaint  Patient presents with   Hypertension    History of Present Illness:  Carol Lawson is a 61 y.o. female who is being seen today for the evaluation to reestablish cardiac care for her HTN at the request of Antony Contras, MD.  She is doing well.  She tells me that she has been having problems with swings in her BP.  She says that at times her Bp will goes up to 168/121mHg and then drop to 117/725mg.  She does not salt her food but does eat out a lot including fast food.  She tells me that she her BP gets up she feels a heaviness in her chest.  She denies any exertional CP but really does not exert herself.  She denies any SOB, DOE, PND, orthopnea, LE edema, dizziness, palpitations or syncope. She also has been having problems with randomly feel overwhelming weakness.  She has check her BP some during an episode and her BP is usually high.  She is compliant with her meds and is tolerating meds with no SE.     Past Medical History:  Diagnosis Date   GERD (gastroesophageal reflux disease)    Hypertension    not on medication    Past Surgical History:  Procedure Laterality Date   ACL repair of knee  2006   CHOLECYSTECTOMY  2006   intestinal sx     age [age] meniscus repair of knee  2007    Current Medications: Current Meds  Medication Sig   HYDROcodone-acetaminophen (NORCO/VICODIN) 5-325 MG tablet Take 1 tablet by mouth every 6 (six) hours as needed for moderate pain.   ibuprofen (ADVIL,MOTRIN) 200 MG tablet Take 600 mg by mouth every 6 (six) hours as needed for headache or mild pain.    metoprolol tartrate (LOPRESSOR) 100 MG tablet Take 1 tablet (100 mg total) by mouth once for 1 dose. Take 1 tablet (100 mg total) two hours prior to CT scan.   ondansetron (ZOFRAN) 4 MG tablet Take 1 tablet (4 mg total) by  mouth every 8 (eight) hours as needed for nausea or vomiting.   OVER THE COUNTER MEDICATION Place 3 drops under the tongue at bedtime as needed (sleep). CBD Oil    promethazine (PHENERGAN) 25 MG tablet Take 25 mg by mouth every 6 (six) hours as needed for nausea or vomiting.    senna-docusate (SENOKOT-S) 8.6-50 MG tablet Take 1 tablet by mouth at bedtime as needed for mild constipation.    Allergies:   Patient has no known allergies.   Social History   Socioeconomic History   Marital status: Married    Spouse name: Not on file   Number of children: Not on file   Years of education: Not on file   Highest education level: Not on file  Occupational History   Not on file  Tobacco Use   Smoking status: Never   Smokeless tobacco: Never  Vaping Use   Vaping Use: Never used  Substance and Sexual Activity   Alcohol use: Yes    Comment: occasionally wine   Drug use: No   Sexual activity: Not on file  Other Topics Concern   Not on file  Social History Narrative   Not on file   Social Determinants of Health  Financial Resource Strain: Not on file  Food Insecurity: Not on file  Transportation Needs: Not on file  Physical Activity: Not on file  Stress: Not on file  Social Connections: Not on file     Family History:  The patient's Hefamily history includes Breast cancer in her mother; Heart attack in her father; Heart disease in her father.   ROS:   Please see the history of present illness.    ROS All other systems reviewed and are negative.      View : No data to display.             PHYSICAL EXAM:   VS:  BP 136/82   Pulse 74   Ht '5\' 3"'$  (1.6 m)   Wt 163 lb (73.9 kg)   SpO2 99%   BMI 28.87 kg/m    GEN: Well nourished, well developed, in no acute distress  HEENT: normal  Neck: no JVD, carotid bruits, or masses Cardiac: RRR; no murmurs, rubs, or gallops,no edema.  Intact distal pulses bilaterally.  Respiratory:  clear to auscultation bilaterally, normal work  of breathing GI: soft, nontender, nondistended, + BS MS: no deformity or atrophy  Skin: warm and dry, no rash Neuro:  Alert and Oriented x 3, Strength and sensation are intact Psych: euthymic mood, full affect  Wt Readings from Last 3 Encounters:  03/06/22 163 lb (73.9 kg)  10/18/19 171 lb (77.6 kg)  07/23/18 165 lb (74.8 kg)      Studies/Labs Reviewed:   EKG:  EKG is ordered today.  The ekg ordered today demonstrates NSR with no ST changes  Recent Labs: No results found for requested labs within last 8760 hours.   Lipid Panel    Component Value Date/Time   CHOL 163 10/19/2019 0120   TRIG 130 10/19/2019 0120   HDL 27 (L) 10/19/2019 0120   CHOLHDL 6.0 10/19/2019 0120   VLDL 26 10/19/2019 0120   LDLCALC 110 (H) 10/19/2019 0120     Additional studies/ records that were reviewed today include:  OV note from PCP    ASSESSMENT:    1. Chest pain of uncertain etiology   2. Primary hypertension   3. Weakness   4. Precordial pain      PLAN:  In order of problems listed above:  Chest pain -this mainly occurs when her BP is elevated.  She is not very active so difficult to assess if she does enough activity to elicit CP with activity -Her father had an MI at age 49 so she does have CRFs -EKG is nonischemic -I will get a coronary CTA to define coronary anatomy -check 2D echo  2. HTN -she has not been on meds -likely being driven by Na in her diet -I will send her home with a low Na diet pamphlet -check 48 hour BP monitor  3.  Fatigue -she has episodes of transient overwhelming fatigue and weakness related to elevated BP -orthostatic BPs were normal -will get Itamar sleep study to rule out OSA  Medication Adjustments/Labs and Tests Ordered: Current medicines are reviewed at length with the patient today.  Concerns regarding medicines are outlined above.  Medication changes, Labs and Tests ordered today are listed in the Patient Instructions below.  Patient  Instructions  Medication Instructions:  Your physician recommends that you continue on your current medications as directed. Please refer to the Current Medication list given to you today.  *If you need a refill on your cardiac medications before your next  appointment, please call your pharmacy*   Lab Work: TODAY: BMET If you have labs (blood work) drawn today and your tests are completely normal, you will receive your results only by: [place] (if you have MyChart) OR A paper copy in the mail If you have any lab test that is abnormal or we need to change your treatment, we will call you to review the results.   Testing/Procedures: Your physician has requested that you have an echocardiogram. Echocardiography is a painless test that uses sound waves to create images of your heart. It provides your doctor with information about the size and shape of your heart and how well your heart's chambers and valves are working. This procedure takes approximately one hour. There are no restrictions for this procedure.  Your physician has requested that you have a coronary CTA scan. Please see below for further instructions.   Your physician has requested that you wear a 24 hour blood pressure monitor. Our techs will be in touch with you to get this set up.    Follow-Up: At [facility], you and your health needs are our priority.  As part of our continuing mission to provide you with exceptional heart care, we have created designated Provider Care Teams.  These Care Teams include your primary Cardiologist (physician) and Advanced Practice Providers (APPs -  Physician Assistants and Nurse Practitioners) who all work together to provide you with the care you need, when you need it.  Your next appointment:   1 year(s)  The format for your next appointment:   In Person  Provider:   Fransico Him, MD     Other Instructions   Your cardiac CT will be scheduled at:   [facility] [address] [place], [place] [address] [phone]  If scheduled at [facility], please arrive at the [facility] and [facility]'s Entrance (Entrance C2) of [facility] 30 minutes prior to test start time. You can use the FREE valet parking offered at entrance C (encouraged to control the heart rate for the test)  Proceed to the [facility] Radiology Department (first floor) to check-in and test prep.  All radiology patients and guests should use entrance C2 at [facility], accessed from [facility], even though the hospital's physical address listed is [address]     Please follow these instructions carefully (unless otherwise directed):  On the Night Before the Test: Be sure to Drink plenty of water. Do not consume any caffeinated/decaffeinated beverages or chocolate 12 hours prior to your test. Do not take any antihistamines 12 hours prior to your test.  On the Day of the Test: Drink plenty of water until 1 hour prior to the test. Do not eat any food 4 hours prior to the test. You may take your regular medications prior to the test.  Take metoprolol (Lopressor) two hours prior to test. HOLD Furosemide/Hydrochlorothiazide morning of the test. FEMALES- please wear underwire-free bra if available, avoid dresses & tight clothing       After the Test: Drink plenty of water. After receiving IV contrast, you may experience a mild flushed feeling. This is normal. On occasion, you may experience a mild rash up to 24 hours after the test. This is not dangerous. If this occurs, you can take Benadryl 25 mg and increase your fluid intake. If you experience trouble breathing, this can be serious. If it is severe call 911 IMMEDIATELY. If it is mild, please  call our office. If you take any of these medications: Glipizide/Metformin, Avandament, Glucavance, please do not take 48 hours after completing test unless otherwise  instructed.  We will call to schedule your test 2-4 weeks out understanding that some insurance companies will need an authorization prior to the service being performed.   For non-scheduling related questions, please contact the cardiac imaging nurse navigator should you have any questions/concerns: Marchia Bond, Cardiac Imaging Nurse Navigator Gordy Clement, Cardiac Imaging Nurse Navigator [facility] Direct Office Dial: [phone]   For scheduling needs, including cancellations and rescheduling, please call [place], [phone].   Important Information About Sugar         Signed, Fransico Him, MD  03/06/2022 9:13 AM    [facility] [place], [place], [place]  [address] Phone: [phone]; Fax: [phone]

## 2022-03-06 NOTE — Addendum Note (Signed)
Addended by: Antonieta Iba on: 03/06/2022 09:18 AM   Modules accepted: Orders

## 2022-03-06 NOTE — Telephone Encounter (Signed)
ITAMAR SET UP DATE 03/06/22

## 2022-03-06 NOTE — Addendum Note (Signed)
Addended by: Antonieta Iba on: 03/06/2022 09:05 AM   Modules accepted: Orders

## 2022-03-17 ENCOUNTER — Encounter: Payer: Self-pay | Admitting: Cardiology

## 2022-03-17 NOTE — Telephone Encounter (Signed)
Sueanne Margarita, MD  You; Freada Bergeron, CMA; Michae Kava, CMA 7 minutes ago (11:38 AM)   Please address when this patient is to start her Itamar sleep study - I think she needs a code   In regards to eye pain she needs to see her PCP.  In regards to jaw pain ? Etology but has a coronary CTA scheduled for next week.  If she develops any CP she needs to call us

## 2022-03-20 ENCOUNTER — Other Ambulatory Visit: Payer: Self-pay | Admitting: Cardiology

## 2022-03-20 ENCOUNTER — Ambulatory Visit (INDEPENDENT_AMBULATORY_CARE_PROVIDER_SITE_OTHER): Payer: No Typology Code available for payment source

## 2022-03-20 DIAGNOSIS — R531 Weakness: Secondary | ICD-10-CM

## 2022-03-20 DIAGNOSIS — R079 Chest pain, unspecified: Secondary | ICD-10-CM

## 2022-03-20 DIAGNOSIS — R072 Precordial pain: Secondary | ICD-10-CM

## 2022-03-20 DIAGNOSIS — R030 Elevated blood-pressure reading, without diagnosis of hypertension: Secondary | ICD-10-CM

## 2022-03-20 NOTE — Progress Notes (Unsigned)
24 Hour ambulatory blood pressure monitor applied to patients left arm using standard adult cuff. ?

## 2022-03-24 ENCOUNTER — Telehealth (HOSPITAL_COMMUNITY): Payer: Self-pay | Admitting: *Deleted

## 2022-03-24 NOTE — Telephone Encounter (Signed)
Reaching out to patient to offer assistance regarding upcoming cardiac imaging study; pt verbalizes understanding of appt date/time, parking situation and where to check in, pre-test NPO status and medications ordered, and verified current allergies; name and call back number provided for further questions should they arise  Gordy Clement RN Navigator Cardiac Imaging [facility] [phone] office [phone] cell  Patient to take '100mg'$  metoprolol tartrate two hours prior to his cardiac CT scan. She is aware arrive at 2pm.

## 2022-03-25 NOTE — Telephone Encounter (Signed)
Prior Authorization for Carol Lawson, Carol Lawson sent to [facility] via web portal. Tracking Number .   6/13  APPROVED-TT READ [id number] 5/31  The notification/prior authorization reference number is [id number]- VALID 03/15/22-04/14/22

## 2022-03-26 ENCOUNTER — Ambulatory Visit (HOSPITAL_COMMUNITY)
Admission: RE | Admit: 2022-03-26 | Discharge: 2022-03-26 | Disposition: A | Discharge status: Home / Self Care | Payer: No Typology Code available for payment source | Source: Ambulatory Visit | Attending: Cardiology | Admitting: Cardiology

## 2022-03-26 DIAGNOSIS — R072 Precordial pain: Secondary | ICD-10-CM

## 2022-03-26 MED ORDER — IOHEXOL 350 MG/ML SOLN
100.0000 mL | Freq: Once | INTRAVENOUS | Status: AC | PRN
  Administered 2022-03-26: 100 mL via INTRAVENOUS

## 2022-03-26 MED ORDER — NITROGLYCERIN 0.4 MG SL SUBL
0.8000 mg | SUBLINGUAL_TABLET | Freq: Once | SUBLINGUAL | Status: AC
  Administered 2022-03-26: 0.8 mg via SUBLINGUAL

## 2022-03-26 MED ORDER — NITROGLYCERIN 0.4 MG SL SUBL
SUBLINGUAL_TABLET | SUBLINGUAL | Status: AC
  Filled 2022-03-26: qty 2

## 2022-03-27 NOTE — Telephone Encounter (Signed)
Left message for the pt she has been approved for her Itamar sleep study, left PIN# [id number]. Left message if she is able to proceed with sleep study. I did ask if she may able to do between tonight and over the weekend, if not please call the office to let me know when she can do the sleep study.

## 2022-03-30 ENCOUNTER — Encounter (INDEPENDENT_AMBULATORY_CARE_PROVIDER_SITE_OTHER): Payer: No Typology Code available for payment source | Admitting: Cardiology

## 2022-03-30 DIAGNOSIS — G4734 Idiopathic sleep related nonobstructive alveolar hypoventilation: Secondary | ICD-10-CM

## 2022-03-30 DIAGNOSIS — G4733 Obstructive sleep apnea (adult) (pediatric): Secondary | ICD-10-CM

## 2022-03-31 ENCOUNTER — Ambulatory Visit (HOSPITAL_COMMUNITY): Payer: No Typology Code available for payment source | Attending: Internal Medicine

## 2022-03-31 DIAGNOSIS — R079 Chest pain, unspecified: Secondary | ICD-10-CM | POA: Diagnosis not present

## 2022-03-31 DIAGNOSIS — R531 Weakness: Secondary | ICD-10-CM

## 2022-03-31 DIAGNOSIS — R072 Precordial pain: Secondary | ICD-10-CM | POA: Diagnosis not present

## 2022-03-31 DIAGNOSIS — I10 Essential (primary) hypertension: Secondary | ICD-10-CM

## 2022-03-31 LAB — ECHOCARDIOGRAM COMPLETE
Area-P 1/2: 2.69 cm2
S' Lateral: 2.6 cm

## 2022-03-31 NOTE — Procedures (Signed)
   SLEEP STUDY REPORT Patient Information Study Date: 03/30/22 Patient Name: Carol Lawson Patient ID: [id number] Birth Date: [birth year] Age: 61 Gender: Female BMI: 28.9 (W=163 lb, H=5' 3'') Neck Circ.: 89 '' Referring Physician: Fransico Him, MD  TEST DESCRIPTION: Home sleep apnea testing was completed using the WatchPat, a Type 1 device, utilizing peripheral arterial tonometry (PAT), chest movement, actigraphy, pulse oximetry, pulse rate, body position and snore. AHI was calculated with apnea and hypopnea using valid sleep time as the denominator. RDI includes apneas, hypopneas, and RERAs. The data acquired and the scoring of sleep and all associated events were performed in accordance with the recommended standards and specifications as outlined in the AASM Manual for the Scoring of Sleep and Associated Events 2.2.0 (2015). FINDINGS: 1. Moderate Obstructive Sleep Apnea with AHI 16.2/hr. 2. No Central Sleep Apnea with pAHIc 2.2/hr. 3. Oxygen desaturations as low as 60%. 4. Moderate snoring was present. O2 sats were < 88% for 31.7 min. 5. Total sleep time was 7 hrs and 24 min. 6. 32.4% of total sleep time was spent in REM sleep. 7. Normal sleep onset latency at 16 min 8. Shortened REM sleep onset latency at 47 min. 9. Total awakenings were 10.  DIAGNOSIS: Moderate Obstructive Sleep Apnea (G47.33) Nocturnal Hypoxemia  RECOMMENDATIONS: 1. Clinical correlation of these findings is necessary. The decision to treat obstructive sleep apnea (OSA) is usually based on the presence of apnea symptoms or the presence of associated medical conditions such as Hypertension, Congestive Heart Failure, Atrial Fibrillation or Obesity. The most common symptoms of OSA are snoring, gasping for breath while sleeping, daytime sleepiness and fatigue.  2. Initiating apnea therapy is recommended given the presence of symptoms and/or associated conditions. Recommend proceeding with one of the  following:   a. Auto-CPAP therapy with a pressure range of 5-20cm H2O.   b. An oral appliance (OA) that can be obtained from certain dentists with expertise in sleep medicine. These are primarily of use in non-obese patients with mild and moderate disease.   c. An ENT consultation which may be useful to look for specific causes of obstruction and possible treatment options.   d. If patient is intolerant to PAP therapy, consider referral to ENT for evaluation for hypoglossal nerve stimulator.  3. Close follow-up is necessary to ensure success with CPAP or oral appliance therapy for maximum benefit .  4. A follow-up oximetry study on CPAP is recommended to assess the adequacy of therapy and determine the need for supplemental oxygen or the potential need for Bi-level therapy. An arterial blood gas to determine the adequacy of baseline ventilation and oxygenation should also be considered.  5. Healthy sleep recommendations include: adequate nightly sleep (normal 7-9 hrs/night), avoidance of caffeine after noon and alcohol near bedtime, and maintaining a sleep environment that is cool, dark and quiet.  6. Weight loss for overweight patients is recommended. Even modest amounts of weight loss can significantly improve the severity of sleep apnea.  7. Snoring recommendations include: weight loss where appropriate, side sleeping, and avoidance of alcohol before bed.  8. Operation of motor vehicle should not be performed when sleepy.  Signature: Electronically Signed: 03/31/22 Fransico Him, MD; [facility]; [place], American Board of Sleep Medicine

## 2022-04-03 ENCOUNTER — Ambulatory Visit: Payer: No Typology Code available for payment source

## 2022-04-03 DIAGNOSIS — R531 Weakness: Secondary | ICD-10-CM

## 2022-04-03 DIAGNOSIS — R079 Chest pain, unspecified: Secondary | ICD-10-CM

## 2022-04-03 DIAGNOSIS — R072 Precordial pain: Secondary | ICD-10-CM

## 2022-04-03 DIAGNOSIS — I10 Essential (primary) hypertension: Secondary | ICD-10-CM

## 2022-04-21 ENCOUNTER — Telehealth: Payer: Self-pay | Admitting: *Deleted

## 2022-04-21 DIAGNOSIS — G4733 Obstructive sleep apnea (adult) (pediatric): Secondary | ICD-10-CM

## 2022-04-21 DIAGNOSIS — I10 Essential (primary) hypertension: Secondary | ICD-10-CM

## 2022-04-21 DIAGNOSIS — G4734 Idiopathic sleep related nonobstructive alveolar hypoventilation: Secondary | ICD-10-CM

## 2022-04-21 NOTE — Telephone Encounter (Signed)
-----   Message from Lauralee Evener, [place] sent at 04/01/2022  8:26 AM EDT -----  ----- Message ----- From: Sueanne Margarita, MD Sent: 03/31/2022   6:33 PM EDT To: [facility] Sleep Studies  Please let patient know that they have sleep apnea.  Recommend therapeutic CPAP titration for treatment of patient's sleep disordered breathing.  If unable to perform an in lab titration then initiate ResMed auto CPAP from 4 to 15cm H2O with heated humidity and mask of choice and overnight pulse ox on CPAP.

## 2022-04-21 NOTE — Telephone Encounter (Signed)
The patient has been notified of the result. Left detailed message on voicemail and informed patient to call back. Marolyn Hammock, [place] 04/21/2022 6:10 PM

## 2022-04-24 NOTE — Telephone Encounter (Signed)
Return call: The patient has been notified of the result and verbalized understanding.  All questions (if any) were answered. Marolyn Hammock, [place] 04/24/2022 20:80 PM    Precert titration

## 2022-04-24 NOTE — Telephone Encounter (Signed)
TITRATION DENIED-PATIENT NOT ELIGIBLE  If unable to perform an in lab titration then initiate ResMed auto CPAP from 4 to 15cm H2O with heated humidity and mask of choice and overnight pulse ox on CPAP.      Upon patient request DME selection is [facility] Patient understands he will be contacted by [facility] to set up his cpap. Patient understands to call if [facility] does not contact him with new setup in a timely manner. Patient understands they will be called once confirmation has been received from [facility] that they have received their new machine to schedule 10 week follow up appointment.   [facility] notified of new cpap order  Please add to airview Patient was grateful for the call and thanked me.

## 2022-05-22 ENCOUNTER — Encounter: Payer: Self-pay | Admitting: Cardiology
